# Patient Record
Sex: Female | Born: 1985 | Race: White | Hispanic: No | Marital: Married | Smoking: Former smoker
Health system: Southern US, Community
[De-identification: ages and names within clinical notes are randomized; demographics above are authoritative.]

## PROBLEM LIST (undated history)

## (undated) ENCOUNTER — Inpatient Hospital Stay (HOSPITAL_COMMUNITY): Payer: Self-pay

## (undated) DIAGNOSIS — K219 Gastro-esophageal reflux disease without esophagitis: Secondary | ICD-10-CM

## (undated) DIAGNOSIS — B999 Unspecified infectious disease: Secondary | ICD-10-CM

## (undated) DIAGNOSIS — O139 Gestational [pregnancy-induced] hypertension without significant proteinuria, unspecified trimester: Secondary | ICD-10-CM

## (undated) DIAGNOSIS — J40 Bronchitis, not specified as acute or chronic: Secondary | ICD-10-CM

---
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
## 2007-01-15 ENCOUNTER — Emergency Department: Payer: Self-pay | Admitting: Emergency Medicine

## 2007-05-15 ENCOUNTER — Emergency Department: Payer: Self-pay | Admitting: Emergency Medicine

## 2007-05-18 ENCOUNTER — Observation Stay: Payer: Self-pay | Admitting: Obstetrics & Gynecology

## 2007-05-23 ENCOUNTER — Observation Stay: Payer: Self-pay

## 2007-07-08 ENCOUNTER — Observation Stay: Payer: Self-pay

## 2007-07-23 ENCOUNTER — Emergency Department: Payer: Self-pay

## 2007-07-23 ENCOUNTER — Emergency Department: Payer: Self-pay | Admitting: Emergency Medicine

## 2007-07-30 ENCOUNTER — Inpatient Hospital Stay: Payer: Self-pay | Admitting: Unknown Physician Specialty

## 2007-08-07 ENCOUNTER — Emergency Department: Payer: Self-pay | Admitting: Emergency Medicine

## 2007-08-10 ENCOUNTER — Emergency Department: Payer: Self-pay | Admitting: Emergency Medicine

## 2007-11-10 ENCOUNTER — Emergency Department: Payer: Self-pay | Admitting: Emergency Medicine

## 2008-04-27 ENCOUNTER — Observation Stay: Payer: Self-pay

## 2008-06-02 ENCOUNTER — Observation Stay: Payer: Self-pay | Admitting: Obstetrics & Gynecology

## 2008-06-13 ENCOUNTER — Observation Stay: Payer: Self-pay | Admitting: Obstetrics and Gynecology

## 2008-07-18 ENCOUNTER — Inpatient Hospital Stay: Payer: Self-pay | Admitting: Obstetrics & Gynecology

## 2009-05-08 ENCOUNTER — Emergency Department (HOSPITAL_COMMUNITY): Admission: EM | Admit: 2009-05-08 | Discharge: 2009-05-08 | Payer: Self-pay | Admitting: Family Medicine

## 2010-09-21 ENCOUNTER — Emergency Department (HOSPITAL_COMMUNITY): Admission: EM | Admit: 2010-09-21 | Discharge: 2010-09-21 | Payer: Self-pay | Admitting: Family Medicine

## 2011-02-24 LAB — POCT RAPID STREP A (OFFICE): Streptococcus, Group A Screen (Direct): NEGATIVE

## 2011-03-22 LAB — POCT RAPID STREP A (OFFICE): Streptococcus, Group A Screen (Direct): NEGATIVE

## 2011-04-12 ENCOUNTER — Inpatient Hospital Stay (INDEPENDENT_AMBULATORY_CARE_PROVIDER_SITE_OTHER)
Admission: RE | Admit: 2011-04-12 | Discharge: 2011-04-12 | Disposition: A | Discharge status: Home / Self Care | Payer: Self-pay | Source: Ambulatory Visit | Attending: Family Medicine | Admitting: Family Medicine

## 2011-04-12 DIAGNOSIS — B353 Tinea pedis: Secondary | ICD-10-CM

## 2012-11-06 ENCOUNTER — Emergency Department (INDEPENDENT_AMBULATORY_CARE_PROVIDER_SITE_OTHER)
Admission: EM | Admit: 2012-11-06 | Discharge: 2012-11-06 | Disposition: A | Discharge status: Home / Self Care | Payer: Self-pay | Source: Home / Self Care | Attending: Family Medicine | Admitting: Family Medicine

## 2012-11-06 ENCOUNTER — Encounter (HOSPITAL_COMMUNITY): Payer: Self-pay | Admitting: Emergency Medicine

## 2012-11-06 DIAGNOSIS — J069 Acute upper respiratory infection, unspecified: Secondary | ICD-10-CM

## 2012-11-06 HISTORY — DX: Bronchitis, not specified as acute or chronic: J40

## 2012-11-06 MED ORDER — IPRATROPIUM BROMIDE 0.06 % NA SOLN: 2.0000 | Freq: Four times a day (QID) | NASAL | Status: DC

## 2012-11-06 NOTE — ED Provider Notes (Signed)
History     CSN: [id number]  Arrival date & time 11/06/12  1111   First MD Initiated Contact with Patient 11/06/12 1116      Chief Complaint  Patient presents with  . Bronchitis    (Consider location/radiation/quality/duration/timing/severity/associated sxs/prior treatment) Patient is a 25 y.o. female presenting with cough. The history is provided by the patient.  Cough This is a new problem. The current episode started 2 days ago. The problem has not changed since onset.The cough is productive of sputum. There has been no fever. Associated symptoms include rhinorrhea and sore throat. Pertinent negatives include no shortness of breath and no wheezing. She is not a smoker. Her past medical history is significant for bronchitis.    Past Medical History  Diagnosis Date  . Bronchitis     Past Surgical History  Procedure Date  . Cesarean section     No family history on file.  History  Substance Use Topics  . Smoking status: Former Games developer  . Smokeless tobacco: Not on file  . Alcohol Use: No    OB History    Grav Para Term Preterm Abortions TAB SAB Ect Mult Living                  Review of Systems  Constitutional: Negative.   HENT: Positive for congestion, sore throat, rhinorrhea and postnasal drip.   Respiratory: Positive for cough. Negative for shortness of breath and wheezing.   Cardiovascular: Negative.   Gastrointestinal: Negative.     Allergies  Review of patient's allergies indicates no known allergies.  Home Medications   Current Outpatient Rx  Name  Route  Sig  Dispense  Refill  . IPRATROPIUM BROMIDE 0.06 % NA SOLN   Nasal   Place 2 sprays into the nose 4 (four) times daily.   15 mL   1     BP 106/61  Pulse 80  Temp 97.9 F (36.6 C) (Oral)  Resp 20  SpO2 100%  LMP 10/12/2012  Physical Exam  Nursing note and vitals reviewed. Constitutional: She is oriented to person, place, and time. She appears well-developed and well-nourished.    HENT:  Head: Normocephalic.  Right Ear: External ear normal.  Left Ear: External ear normal.  Nose: Mucosal edema and rhinorrhea present.  Mouth/Throat: Oropharynx is clear and moist.  Eyes: Pupils are equal, round, and reactive to light.  Neck: Normal range of motion. Neck supple.  Cardiovascular: Regular rhythm and normal heart sounds.   Pulmonary/Chest: Effort normal and breath sounds normal.  Abdominal: Soft. Bowel sounds are normal.  Lymphadenopathy:    She has no cervical adenopathy.  Neurological: She is alert and oriented to person, place, and time.  Skin: Skin is warm and dry.    ED Course  Procedures (including critical care time)  Labs Reviewed - No data to display No results found.   1. URI (upper respiratory infection)       MDM          Linna Hoff, MD 11/06/12 1241

## 2012-11-06 NOTE — ED Notes (Signed)
Started 2 days ago of touches across chest as location of pain.  Hurts all the time, breathing in makes pain worse, sharp pain, green phlegm/brown.  Phlegm is difficult to cough out.  No fever.  Reports sore throat, right ear pain minimal

## 2012-11-06 NOTE — ED Notes (Signed)
Mother being seen with her child

## 2013-02-13 ENCOUNTER — Emergency Department (INDEPENDENT_AMBULATORY_CARE_PROVIDER_SITE_OTHER): Payer: Self-pay

## 2013-02-13 ENCOUNTER — Emergency Department (INDEPENDENT_AMBULATORY_CARE_PROVIDER_SITE_OTHER)
Admission: EM | Admit: 2013-02-13 | Discharge: 2013-02-13 | Disposition: A | Discharge status: Home / Self Care | Payer: Self-pay | Source: Home / Self Care | Attending: Emergency Medicine | Admitting: Emergency Medicine

## 2013-02-13 ENCOUNTER — Encounter (HOSPITAL_COMMUNITY): Payer: Self-pay

## 2013-02-13 DIAGNOSIS — S161XXA Strain of muscle, fascia and tendon at neck level, initial encounter: Secondary | ICD-10-CM

## 2013-02-13 DIAGNOSIS — S46012A Strain of muscle(s) and tendon(s) of the rotator cuff of left shoulder, initial encounter: Secondary | ICD-10-CM

## 2013-02-13 MED ORDER — METHOCARBAMOL 500 MG PO TABS: 500.0000 mg | ORAL_TABLET | Freq: Three times a day (TID) | ORAL | Status: DC

## 2013-02-13 MED ORDER — MELOXICAM 15 MG PO TABS: 15.0000 mg | ORAL_TABLET | Freq: Every day | ORAL | Status: DC

## 2013-02-13 MED ORDER — TRAMADOL HCL 50 MG PO TABS: 100.0000 mg | ORAL_TABLET | Freq: Three times a day (TID) | ORAL | Status: DC | PRN
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
## 2013-05-02 ENCOUNTER — Encounter (HOSPITAL_COMMUNITY): Payer: Self-pay

## 2013-05-02 ENCOUNTER — Inpatient Hospital Stay (HOSPITAL_COMMUNITY)
Admission: AD | Admit: 2013-05-02 | Discharge: 2013-05-02 | Disposition: A | Discharge status: Home / Self Care | Payer: Medicaid Other | Source: Ambulatory Visit | Attending: Obstetrics & Gynecology | Admitting: Obstetrics & Gynecology

## 2013-05-02 ENCOUNTER — Inpatient Hospital Stay (HOSPITAL_COMMUNITY): Payer: Medicaid Other

## 2013-05-02 DIAGNOSIS — B9689 Other specified bacterial agents as the cause of diseases classified elsewhere: Secondary | ICD-10-CM | POA: Insufficient documentation

## 2013-05-02 DIAGNOSIS — O9989 Other specified diseases and conditions complicating pregnancy, childbirth and the puerperium: Secondary | ICD-10-CM

## 2013-05-02 DIAGNOSIS — A499 Bacterial infection, unspecified: Secondary | ICD-10-CM | POA: Insufficient documentation

## 2013-05-02 DIAGNOSIS — N760 Acute vaginitis: Secondary | ICD-10-CM | POA: Insufficient documentation

## 2013-05-02 DIAGNOSIS — O2390 Unspecified genitourinary tract infection in pregnancy, unspecified trimester: Secondary | ICD-10-CM | POA: Insufficient documentation

## 2013-05-02 DIAGNOSIS — R109 Unspecified abdominal pain: Secondary | ICD-10-CM | POA: Insufficient documentation

## 2013-05-02 LAB — WET PREP, GENITAL: Trich, Wet Prep: NONE SEEN

## 2013-05-02 LAB — URINALYSIS, ROUTINE W REFLEX MICROSCOPIC
Bilirubin Urine: NEGATIVE
Glucose, UA: NEGATIVE mg/dL
Ketones, ur: 15 mg/dL — AB
Leukocytes, UA: NEGATIVE
Nitrite: NEGATIVE
Protein, ur: NEGATIVE mg/dL
pH: 6 (ref 5.0–8.0)

## 2013-05-02 LAB — CBC
Hemoglobin: 12.7 g/dL (ref 12.0–15.0)
MCH: 30.5 pg (ref 26.0–34.0)
MCHC: 33.9 g/dL (ref 30.0–36.0)
MCV: 89.9 fL (ref 78.0–100.0)
RBC: 4.17 MIL/uL (ref 3.87–5.11)

## 2013-05-02 LAB — URINE MICROSCOPIC-ADD ON

## 2013-05-02 MED ORDER — METRONIDAZOLE 500 MG PO TABS: 500.0000 mg | ORAL_TABLET | Freq: Two times a day (BID) | ORAL | Status: DC

## 2013-05-02 NOTE — MAU Provider Note (Signed)
History     CSN: [id number]  Arrival date and time: 05/02/13 1642   First Provider Initiated Contact with Patient 05/02/13 1946      Chief Complaint  Patient presents with  . Possible Pregnancy  . Abdominal Pain   HPI [name] is a 27 y.o. G4P3 at [redacted]w[redacted]d who presents to MAU today with complaint of +HPT and lower abdominal cramping. The patient states that she had Implanon removed in March 2014 and had one normal period in late March and then a short, light period in April. She had +HPT on Monday. Last night she started having lower abdominal cramping and occasional sharp pains in the lower abdomen. She denies vaginal bleeding, N/V, fever, dizziness, dysuria or urgency. She states some increased urinary frequency and vaginal discharge. The discharge is white and thin.   OB History   Grav Para Term Preterm Abortions TAB SAB Ect Mult Living   4 3        3       Past Medical History  Diagnosis Date  . Bronchitis   . Complication of anesthesia     Past Surgical History  Procedure Laterality Date  . Cesarean section      History reviewed. No pertinent family history.  History  Substance Use Topics  . Smoking status: Former Games developer  . Smokeless tobacco: Not on file  . Alcohol Use: No    Allergies: No Known Allergies  Prescriptions prior to admission  Medication Sig Dispense Refill  . Prenatal Vit-Fe Fumarate-FA (PRENATAL MULTIVITAMIN) TABS Take 1 tablet by mouth daily at 12 noon.      Marland Kitchen ipratropium (ATROVENT) 0.06 % nasal spray Place 2 sprays into the nose 4 (four) times daily.  15 mL  1  . traMADol (ULTRAM) 50 MG tablet Take 2 tablets (100 mg total) by mouth every 8 (eight) hours as needed for pain.  30 tablet  0  . [DISCONTINUED] meloxicam (MOBIC) 15 MG tablet Take 1 tablet (15 mg total) by mouth daily.  15 tablet  0  . [DISCONTINUED] methocarbamol (ROBAXIN) 500 MG tablet Take 1 tablet (500 mg total) by mouth 3 (three) times daily.  30 tablet  0    Review of  Systems  Constitutional: Negative for fever, chills and malaise/fatigue.  Gastrointestinal: Negative for nausea, vomiting, diarrhea and constipation.  Genitourinary: Positive for frequency. Negative for dysuria and urgency.       Neg - vaginal bleeding + vaginal discharge  Neurological: Negative for dizziness and loss of consciousness.   Physical Exam   Blood pressure 123/53, pulse 86, temperature 98.6 F (37 C), temperature source Oral, resp. rate 16, height 5\' 5"  (1.651 m), weight 113.399 kg (250 lb), last menstrual period 04/04/2013, SpO2 100.00%.  Physical Exam  Constitutional: She is oriented to person, place, and time. She appears well-developed and well-nourished. No distress.  HENT:  Head: Normocephalic and atraumatic.  Cardiovascular: Normal rate, regular rhythm and normal heart sounds.   Respiratory: Effort normal and breath sounds normal. No respiratory distress.  GI: Soft. Bowel sounds are normal. She exhibits no distension and no mass. There is tenderness (mild LLQ tenderness to palpation). There is no rebound and no guarding.  Genitourinary: Uterus is not enlarged and not tender. Cervix exhibits no motion tenderness, no discharge and no friability. Right adnexum displays no mass and no tenderness. Left adnexum displays no mass and no tenderness. No bleeding around the vagina. Vaginal discharge (scant thin, white discharge noted) found.  Neurological: She  is alert and oriented to person, place, and time.  Skin: Skin is warm and dry. No erythema.  Psychiatric: She has a normal mood and affect.    Results for orders placed during the hospital encounter of 05/02/13 (from the past 24 hour(s))  URINALYSIS, ROUTINE W REFLEX MICROSCOPIC     Status: Abnormal   Collection Time    05/02/13  5:20 PM      Result Value Range   Color, Urine YELLOW  YELLOW   APPearance HAZY (*) CLEAR   Specific Gravity, Urine >1.030 (*) 1.005 - 1.030   pH 6.0  5.0 - 8.0   Glucose, UA NEGATIVE   NEGATIVE mg/dL   Hgb urine dipstick TRACE (*) NEGATIVE   Bilirubin Urine NEGATIVE  NEGATIVE   Ketones, ur 15 (*) NEGATIVE mg/dL   Protein, ur NEGATIVE  NEGATIVE mg/dL   Urobilinogen, UA 0.2  0.0 - 1.0 mg/dL   Nitrite NEGATIVE  NEGATIVE   Leukocytes, UA NEGATIVE  NEGATIVE  URINE MICROSCOPIC-ADD ON     Status: Abnormal   Collection Time    05/02/13  5:20 PM      Result Value Range   Squamous Epithelial / LPF FEW (*) RARE   RBC / HPF 0-2  <3 RBC/hpf  POCT PREGNANCY, URINE     Status: Abnormal   Collection Time    05/02/13  5:51 PM      Result Value Range   Preg Test, Ur POSITIVE (*) NEGATIVE  HCG, QUANTITATIVE, PREGNANCY     Status: Abnormal   Collection Time    05/02/13  6:05 PM      Result Value Range   hCG, Beta Chain, Quant, S 396 (*) <5 mIU/mL  CBC     Status: None   Collection Time    05/02/13  6:06 PM      Result Value Range   WBC 7.1  4.0 - 10.5 K/uL   RBC 4.17  3.87 - 5.11 MIL/uL   Hemoglobin 12.7  12.0 - 15.0 g/dL   HCT 78.2  95.6 - 21.3 %   MCV 89.9  78.0 - 100.0 fL   MCH 30.5  26.0 - 34.0 pg   MCHC 33.9  30.0 - 36.0 g/dL   RDW 08.6  57.8 - 46.9 %   Platelets 209  150 - 400 K/uL  ABO/RH     Status: None   Collection Time    05/02/13  6:06 PM      Result Value Range   ABO/RH(D) O POS    WET PREP, GENITAL     Status: Abnormal   Collection Time    05/02/13  7:55 PM      Result Value Range   Yeast Wet Prep HPF POC NONE SEEN  NONE SEEN   Trich, Wet Prep NONE SEEN  NONE SEEN   Clue Cells Wet Prep HPF POC FEW (*) NONE SEEN   WBC, Wet Prep HPF POC FEW (*) NONE SEEN    MAU Course  Procedures None  MDM Wet Prep, GC/Chlamydia, CBC, ABO/Rh, quant hCG and [place] today Discussed [place] results with the patient. She is aware of all possibilities for outcome of this pregnancy. Early IUP vs Ectopic vs SAB. The patient has voiced understanding.  Assessment and Plan  A: Positive blood pregnancy test Abdominal pain in early pregnancy Bacterial vaginosis  P: Discharge  home Rx for Flagyl sent to patient's pharmacy Patient advised to follow-up in 48 hours for repeat quant hCG Bleeding and  Ectopic precautions discussed Patient may return to MAU sooner as needed  Freddi Starr, PA-C  05/02/2013, 8:31 PM

## 2013-05-02 NOTE — MAU Provider Note (Signed)
Attestation of Attending Supervision of Advanced Practitioner (PA/CNM/NP): Evaluation and management procedures were performed by the Advanced Practitioner under my supervision and collaboration.  I have reviewed the Advanced Practitioner's note and chart, and I agree with the management and plan.  Keidan Aumiller, MD, FACOG Attending Obstetrician & Gynecologist Faculty Practice, [facility]  

## 2013-05-02 NOTE — MAU Note (Signed)
Patient states she has had 3 positive home pregnancy tests. States she started having lower abdominal pain this am, more on the left but radiates to the right. Had an implanon removed in March that was a year overdue to be removed. Had bleeding on 3-16 after the removal then had bleeding again on 4-24, but not sure what was a period. Denies bleeding or discharge.

## 2013-05-03 LAB — GC/CHLAMYDIA PROBE AMP
CT Probe RNA: NEGATIVE
GC Probe RNA: NEGATIVE

## 2013-05-04 ENCOUNTER — Inpatient Hospital Stay (HOSPITAL_COMMUNITY)
Admission: AD | Admit: 2013-05-04 | Discharge: 2013-05-04 | Disposition: A | Discharge status: Home / Self Care | Payer: Medicaid Other | Source: Ambulatory Visit | Attending: Obstetrics & Gynecology | Admitting: Obstetrics & Gynecology

## 2013-05-04 DIAGNOSIS — E860 Dehydration: Secondary | ICD-10-CM | POA: Insufficient documentation

## 2013-05-04 DIAGNOSIS — Z0189 Encounter for other specified special examinations: Secondary | ICD-10-CM

## 2013-05-04 DIAGNOSIS — R109 Unspecified abdominal pain: Secondary | ICD-10-CM | POA: Insufficient documentation

## 2013-05-04 DIAGNOSIS — O99891 Other specified diseases and conditions complicating pregnancy: Secondary | ICD-10-CM | POA: Insufficient documentation

## 2013-05-04 NOTE — MAU Provider Note (Signed)
  History     CSN: [id number]  Arrival date and time: 05/04/13 1200  Seen by Provider at 1305    No chief complaint on file.  HPI Kristen Warner 27 y.o. [redacted]w[redacted]d Returns to MAU today for repeat quant.  Was seen on 05-02-13 with lower abdominal cramping which may have been due to mild dehydration.  Having no pain today.  No vaginal bleeding.  OB History   Grav Para Term Preterm Abortions TAB SAB Ect Mult Living   4 3        3       Past Medical History  Diagnosis Date  . Bronchitis   . Complication of anesthesia     Past Surgical History  Procedure Laterality Date  . Cesarean section      No family history on file.  History  Substance Use Topics  . Smoking status: Former Games developer  . Smokeless tobacco: Not on file  . Alcohol Use: No    Allergies: No Known Allergies  Prescriptions prior to admission  Medication Sig Dispense Refill  . ipratropium (ATROVENT) 0.06 % nasal spray Place 2 sprays into the nose 4 (four) times daily.  15 mL  1  . metroNIDAZOLE (FLAGYL) 500 MG tablet Take 1 tablet (500 mg total) by mouth 2 (two) times daily.  14 tablet  0  . Prenatal Vit-Fe Fumarate-FA (PRENATAL MULTIVITAMIN) TABS Take 1 tablet by mouth daily at 12 noon.      . traMADol (ULTRAM) 50 MG tablet Take 2 tablets (100 mg total) by mouth every 8 (eight) hours as needed for pain.  30 tablet  0    Review of Systems  Constitutional: Negative for fever.  Gastrointestinal: Negative for nausea, vomiting and abdominal pain.  Genitourinary:       No vaginal discharge. No vaginal bleeding. No dysuria.   Physical Exam   Last menstrual period 04/04/2013.  Physical Exam  Nursing note and vitals reviewed. Constitutional: She is oriented to person, place, and time. She appears well-developed and well-nourished.  HENT:  Head: Normocephalic.  Eyes: EOM are normal.  Neck: Neck supple.  Musculoskeletal: Normal range of motion.  Neurological: She is alert and oriented to person, place, and time.   Skin: Skin is warm and dry.  Psychiatric: She has a normal mood and affect.    MAU Course  Procedures Results for Kristen, Warner (MRN [id number]) as of 05/04/2013 13:03  Ref. Range 05/02/2013 18:06 05/02/2013 19:21 05/02/2013 19:55 05/04/2013 01:09 05/04/2013 12:29  hCG, Beta Chain, Quant, S Latest Range: <5 mIU/mL 396    842 (H)   MDM Appropriate rise in quant in 48 hours  Assessment and Plan  Appropriately rising quants  Plan Ultrasound to call client and schedule repeat ultrasound on 05-09-13 or later. Return sooner for vaginal bleeding or severe pain.  Kristen Warner 05/04/2013, 1:04 PM

## 2013-05-07 ENCOUNTER — Other Ambulatory Visit: Payer: Self-pay | Admitting: Obstetrics and Gynecology

## 2013-05-07 DIAGNOSIS — R109 Unspecified abdominal pain: Secondary | ICD-10-CM

## 2013-05-09 ENCOUNTER — Ambulatory Visit (HOSPITAL_COMMUNITY)
Admission: RE | Admit: 2013-05-09 | Discharge: 2013-05-09 | Disposition: A | Discharge status: Home / Self Care | Payer: Medicaid Other | Source: Ambulatory Visit | Attending: Obstetrics and Gynecology | Admitting: Obstetrics and Gynecology

## 2013-05-09 ENCOUNTER — Inpatient Hospital Stay (HOSPITAL_COMMUNITY)
Admission: AD | Admit: 2013-05-09 | Discharge: 2013-05-09 | Disposition: A | Discharge status: Home / Self Care | Payer: Medicaid Other | Source: Ambulatory Visit | Attending: Family Medicine | Admitting: Family Medicine

## 2013-05-09 DIAGNOSIS — O99210 Obesity complicating pregnancy, unspecified trimester: Secondary | ICD-10-CM | POA: Insufficient documentation

## 2013-05-09 DIAGNOSIS — Z3689 Encounter for other specified antenatal screening: Secondary | ICD-10-CM | POA: Insufficient documentation

## 2013-05-09 DIAGNOSIS — O99891 Other specified diseases and conditions complicating pregnancy: Secondary | ICD-10-CM | POA: Insufficient documentation

## 2013-05-09 DIAGNOSIS — O26899 Other specified pregnancy related conditions, unspecified trimester: Secondary | ICD-10-CM

## 2013-05-09 DIAGNOSIS — R109 Unspecified abdominal pain: Secondary | ICD-10-CM | POA: Insufficient documentation

## 2013-05-09 DIAGNOSIS — E669 Obesity, unspecified: Secondary | ICD-10-CM | POA: Insufficient documentation

## 2013-05-09 NOTE — Discharge Instructions (Signed)
Abdominal Pain During Pregnancy Abdominal discomfort is common in pregnancy. Most of the time, it does not cause harm. There are many causes of abdominal pain. Some causes are more serious than others. Some of the causes of abdominal pain in pregnancy are easily diagnosed. Occasionally, the diagnosis takes time to understand. Other times, the cause is not determined. Abdominal pain can be a sign that something is very wrong with the pregnancy, or the pain may have nothing to do with the pregnancy at all. For this reason, always tell your caregiver if you have any abdominal discomfort. CAUSES Common and harmless causes of abdominal pain include:  Constipation.  Excess gas and bloating.  Round ligament pain. This is pain that is felt in the folds of the groin.  The position the baby or placenta is in.  Baby kicks.  Braxton-Hicks contractions. These are mild contractions that do not cause cervical dilation. Serious causes of abdominal pain include:  Ectopic pregnancy. This happens when a fertilized egg implants outside of the uterus.  Miscarriage.  Preterm labor. This is when labor starts at less than 37 weeks of pregnancy.  Placental abruption. This is when the placenta partially or completely separates from the uterus.  Preeclampsia. This is often associated with high blood pressure and has been referred to as "toxemia in pregnancy."  Uterine or amniotic fluid infections. Causes unrelated to pregnancy include:  Urinary tract infection.  Gallbladder stones or inflammation.  Hepatitis or other liver illness.  Intestinal problems, stomach flu, food poisoning, or ulcer.  Appendicitis.  Kidney (renal) stones.  Kidney infection (pylonephritis). HOME CARE INSTRUCTIONS  For mild pain:  Do not have sexual intercourse or put anything in your vagina until your symptoms go away completely.  Get plenty of rest until your pain improves. If your pain does not improve in 1 hour, call  your caregiver.  Drink clear fluids if you feel nauseous. Avoid solid food as long as you are uncomfortable or nauseous.  Only take medicine as directed by your caregiver.  Keep all follow-up appointments with your caregiver. SEEK IMMEDIATE MEDICAL CARE IF:  You are bleeding, leaking fluid, or passing tissue from the vagina.  You have increasing pain or cramping.  You have persistent vomiting.  You have painful or bloody urination.  You have a fever.  You notice a decrease in your baby's movements.  You have extreme weakness or feel faint.  You have shortness of breath, with or without abdominal pain.  You develop a severe headache with abdominal pain.  You have abnormal vaginal discharge with abdominal pain.  You have persistent diarrhea.  You have abdominal pain that continues even after rest, or gets worse. MAKE SURE YOU:   Understand these instructions.  Will watch your condition.  Will get help right away if you are not doing well or get worse. Document Released: 11/28/2005 Document Revised: 02/20/2012 Document Reviewed: 06/24/2011 [facility] Patient Information 2014 [place], [place].  Abdominal Pain During Pregnancy Abdominal discomfort is common in pregnancy. Most of the time, it does not cause harm. There are many causes of abdominal pain. Some causes are more serious than others. Some of the causes of abdominal pain in pregnancy are easily diagnosed. Occasionally, the diagnosis takes time to understand. Other times, the cause is not determined. Abdominal pain can be a sign that something is very wrong with the pregnancy, or the pain may have nothing to do with the pregnancy at all. For this reason, always tell your caregiver if you have  any abdominal discomfort. CAUSES Common and harmless causes of abdominal pain include:  Constipation.  Excess gas and bloating.  Round ligament pain. This is pain that is felt in the folds of the groin.  The position the baby  or placenta is in.  Baby kicks.  Braxton-Hicks contractions. These are mild contractions that do not cause cervical dilation. Serious causes of abdominal pain include:  Ectopic pregnancy. This happens when a fertilized egg implants outside of the uterus.  Miscarriage.  Preterm labor. This is when labor starts at less than 37 weeks of pregnancy.  Placental abruption. This is when the placenta partially or completely separates from the uterus.  Preeclampsia. This is often associated with high blood pressure and has been referred to as "toxemia in pregnancy."  Uterine or amniotic fluid infections. Causes unrelated to pregnancy include:  Urinary tract infection.  Gallbladder stones or inflammation.  Hepatitis or other liver illness.  Intestinal problems, stomach flu, food poisoning, or ulcer.  Appendicitis.  Kidney (renal) stones.  Kidney infection (pylonephritis). HOME CARE INSTRUCTIONS  For mild pain:  Do not have sexual intercourse or put anything in your vagina until your symptoms go away completely.  Get plenty of rest until your pain improves. If your pain does not improve in 1 hour, call your caregiver.  Drink clear fluids if you feel nauseous. Avoid solid food as long as you are uncomfortable or nauseous.  Only take medicine as directed by your caregiver.  Keep all follow-up appointments with your caregiver. SEEK IMMEDIATE MEDICAL CARE IF:  You are bleeding, leaking fluid, or passing tissue from the vagina.  You have increasing pain or cramping.  You have persistent vomiting.  You have painful or bloody urination.  You have a fever.  You notice a decrease in your baby's movements.  You have extreme weakness or feel faint.  You have shortness of breath, with or without abdominal pain.  You develop a severe headache with abdominal pain.  You have abnormal vaginal discharge with abdominal pain.  You have persistent diarrhea.  You have abdominal  pain that continues even after rest, or gets worse. MAKE SURE YOU:   Understand these instructions.  Will watch your condition.  Will get help right away if you are not doing well or get worse. Document Released: 11/28/2005 Document Revised: 02/20/2012 Document Reviewed: 06/24/2011 [facility] Patient Information 2014 [place], [place].

## 2013-05-09 NOTE — MAU Note (Signed)
Patient to MAU after ultrasound for confirmation of viability. Denies pain or bleeding.

## 2013-05-14 ENCOUNTER — Inpatient Hospital Stay (HOSPITAL_COMMUNITY)
Admission: AD | Admit: 2013-05-14 | Discharge: 2013-05-14 | Disposition: A | Discharge status: Home / Self Care | Payer: Medicaid Other | Source: Ambulatory Visit | Attending: Obstetrics & Gynecology | Admitting: Obstetrics & Gynecology

## 2013-05-14 ENCOUNTER — Encounter (HOSPITAL_COMMUNITY): Payer: Self-pay | Admitting: *Deleted

## 2013-05-14 DIAGNOSIS — K219 Gastro-esophageal reflux disease without esophagitis: Secondary | ICD-10-CM | POA: Insufficient documentation

## 2013-05-14 DIAGNOSIS — B3781 Candidal esophagitis: Secondary | ICD-10-CM

## 2013-05-14 DIAGNOSIS — B373 Candidiasis of vulva and vagina: Secondary | ICD-10-CM | POA: Insufficient documentation

## 2013-05-14 DIAGNOSIS — R21 Rash and other nonspecific skin eruption: Secondary | ICD-10-CM | POA: Insufficient documentation

## 2013-05-14 DIAGNOSIS — O2390 Unspecified genitourinary tract infection in pregnancy, unspecified trimester: Secondary | ICD-10-CM | POA: Insufficient documentation

## 2013-05-14 DIAGNOSIS — B353 Tinea pedis: Secondary | ICD-10-CM

## 2013-05-14 DIAGNOSIS — O99891 Other specified diseases and conditions complicating pregnancy: Secondary | ICD-10-CM | POA: Insufficient documentation

## 2013-05-14 DIAGNOSIS — B3731 Acute candidiasis of vulva and vagina: Secondary | ICD-10-CM | POA: Insufficient documentation

## 2013-05-14 DIAGNOSIS — B370 Candidal stomatitis: Secondary | ICD-10-CM

## 2013-05-14 DIAGNOSIS — K920 Hematemesis: Secondary | ICD-10-CM | POA: Insufficient documentation

## 2013-05-14 LAB — URINALYSIS, ROUTINE W REFLEX MICROSCOPIC
Bilirubin Urine: NEGATIVE
Glucose, UA: NEGATIVE mg/dL
Hgb urine dipstick: NEGATIVE
Ketones, ur: NEGATIVE mg/dL
Protein, ur: NEGATIVE mg/dL
pH: 6.5 (ref 5.0–8.0)

## 2013-05-14 LAB — WET PREP, GENITAL: Clue Cells Wet Prep HPF POC: NONE SEEN

## 2013-05-14 MED ORDER — NYSTATIN 100000 UNIT/ML MT SUSP: 500000.0000 [IU] | Freq: Four times a day (QID) | OROMUCOSAL | Status: DC

## 2013-05-14 MED ORDER — MICONAZOLE NITRATE 2 % EX AERO: 1.0000 "application " | INHALATION_SPRAY | Freq: Three times a day (TID) | CUTANEOUS | Status: DC

## 2013-05-14 MED ORDER — FLUCONAZOLE 100 MG PO TABS: 100.0000 mg | ORAL_TABLET | Freq: Every day | ORAL | Status: DC

## 2013-05-14 MED ORDER — GI COCKTAIL ~~LOC~~
30.0000 mL | Freq: Once | ORAL | Status: AC
  Administered 2013-05-14: 30 mL via ORAL
  Filled 2013-05-14: qty 30

## 2013-05-14 MED ORDER — PANTOPRAZOLE SODIUM 20 MG PO TBEC: 20.0000 mg | DELAYED_RELEASE_TABLET | Freq: Every day | ORAL | Status: DC

## 2013-05-14 NOTE — Progress Notes (Signed)
Written and verbal d/c instructions given and understanding voiced. 

## 2013-05-14 NOTE — MAU Provider Note (Signed)
Attestation of Attending Supervision of Advanced Practitioner (CNM/NP): Evaluation and management procedures were performed by the Advanced Practitioner under my supervision and collaboration.  I have reviewed the Advanced Practitioner's note and chart, and I agree with the management and plan.  HARRAWAY-SMITH, Eldridge Marcott 5:18 PM

## 2013-05-14 NOTE — MAU Note (Signed)
Ate ham/egg/cheese biscuit this am. Threw up afterward and saw blood in vomit. Finished antibiotic for BV on Friday. Think I may have yeast infection and possibly thrush. Felt like something was in my throat this morning when I woke up

## 2013-05-14 NOTE — MAU Provider Note (Signed)
History     CSN: [id number]  Arrival date and time: 05/14/13 1325   None     Chief Complaint  Patient presents with  . Hematemesis   HPI This is a 27 y.o. female at [redacted]w[redacted]d who presents with c/o blood in vomitus.  Has only vomited a few times but has daily burning pain c/w esophagitis. Also c/o feeling fullness when swallowing. Thinks she has a yeast infection. Just took Flagyl.  C/O scaling red rash on feet.  Admits to recent water park visit.  RN Note: Ate ham/egg/cheese biscuit this am. Threw up afterward and saw blood in vomit. Finished antibiotic for BV on Friday. Think I may have yeast infection and possibly thrush. Felt like something was in my throat this morning when I woke up       OB History   Grav Para Term Preterm Abortions TAB SAB Ect Mult Living   4 3 1 2      3       Past Medical History  Diagnosis Date  . Bronchitis   . Complication of anesthesia     Past Surgical History  Procedure Laterality Date  . Cesarean section      Family History  Problem Relation Age of Onset  . Hypertension Mother   . Heart disease Father   . Diabetes Sister   . Heart disease Paternal Grandfather     History  Substance Use Topics  . Smoking status: Former Games developer  . Smokeless tobacco: Not on file  . Alcohol Use: No    Allergies: No Known Allergies  Prescriptions prior to admission  Medication Sig Dispense Refill  . acetaminophen (TYLENOL) 500 MG tablet Take 1,000 mg by mouth every 6 (six) hours as needed for pain.      . metroNIDAZOLE (FLAGYL) 500 MG tablet Take 500 mg by mouth 2 (two) times daily.      . miconazole (MICOTIN) 200 MG vaginal suppository Place 200 mg vaginally at bedtime. 3 day treatment started 05-13-13      . Prenatal Vit-Fe Fumarate-FA (PRENATAL MULTIVITAMIN) TABS Take 1 tablet by mouth daily at 12 noon.        Review of Systems  Constitutional: Negative for fever, chills and malaise/fatigue.  Eyes: Negative for blurred vision.  Cardiovascular:  Positive for chest pain (with swallowing, "heartburn").  Neurological: Negative for weakness and headaches.   Physical Exam   Blood pressure 131/60, pulse 86, temperature 98.7 F (37.1 C), temperature source Oral, resp. rate 18, height 5\' 5"  (1.651 m), weight 112.946 kg (249 lb), last menstrual period 04/04/2013, SpO2 99.00%.  Physical Exam  Constitutional: She is oriented to person, place, and time. She appears well-developed and well-nourished. No distress.  HENT:  Head: Normocephalic.  White patches on tongue, mild erethema  Cardiovascular: Normal rate.   Respiratory: Effort normal.  GI: Soft. She exhibits no distension and no mass. There is no tenderness. There is no rebound and no guarding.  Picture of hematemesis showed several cc's of blood in vomitus  Genitourinary: Uterus normal. Vaginal discharge (curdlike discharge) found.  Musculoskeletal: Normal range of motion.  Neurological: She is alert and oriented to person, place, and time.  Skin: Skin is warm and dry.  Peeling, red skin on soles of feet  Psychiatric: She has a normal mood and affect.    MAU Course  Procedures  Assessment and Plan  A:  SIUP at [redacted]w[redacted]d       GERD       Vaginal Yeast  Probable thrush      Tinea Pedis  P:  Discussed yeast infections       I suspect she might be insulin resistant based on family history and habitus. Discussed testing and modifying carbs / sweets in diet       Recommend Activia yogurt and/or Probiotics       Discussed prenatal care. List given of providers. Wants TOLAC after 2 sections, Discussed will need to ask provider if they are willing to try this.           Medication List    STOP taking these medications       miconazole 200 MG vaginal suppository  Commonly known as:  MICOTIN      TAKE these medications       acetaminophen 500 MG tablet  Commonly known as:  TYLENOL  Take 1,000 mg by mouth every 6 (six) hours as needed for pain.     fluconazole 100 MG  tablet  Commonly known as:  DIFLUCAN  Take 1 tablet (100 mg total) by mouth daily.     metroNIDAZOLE 500 MG tablet  Commonly known as:  FLAGYL  Take 500 mg by mouth 2 (two) times daily.     Miconazole Nitrate 2 % Aero  Apply 1 application topically every 8 (eight) hours.     nystatin 100000 UNIT/ML suspension  Commonly known as:  MYCOSTATIN  Take 5 mLs (500,000 Units total) by mouth 4 (four) times daily.     pantoprazole 20 MG tablet  Commonly known as:  PROTONIX  Take 1 tablet (20 mg total) by mouth daily.     prenatal multivitamin Tabs  Take 1 tablet by mouth daily at 12 noon.          Wynelle Bourgeois 05/14/2013, 2:16 PM

## 2013-05-14 NOTE — Progress Notes (Signed)
Wet prep only collected without speculum.

## 2013-05-14 NOTE — MAU Note (Signed)
Pt states GI cocktail helped initially but "still feel like I have heartburn. Still feels like something stuck in my chest"

## 2013-05-17 ENCOUNTER — Inpatient Hospital Stay (HOSPITAL_COMMUNITY)
Admission: AD | Admit: 2013-05-17 | Discharge: 2013-05-18 | Disposition: A | Discharge status: Home / Self Care | Payer: Medicaid Other | Source: Ambulatory Visit | Attending: Obstetrics & Gynecology | Admitting: Obstetrics & Gynecology

## 2013-05-17 ENCOUNTER — Encounter (HOSPITAL_COMMUNITY): Payer: Self-pay

## 2013-05-17 DIAGNOSIS — O209 Hemorrhage in early pregnancy, unspecified: Secondary | ICD-10-CM

## 2013-05-17 DIAGNOSIS — B373 Candidiasis of vulva and vagina: Secondary | ICD-10-CM | POA: Insufficient documentation

## 2013-05-17 DIAGNOSIS — R109 Unspecified abdominal pain: Secondary | ICD-10-CM | POA: Insufficient documentation

## 2013-05-17 DIAGNOSIS — O210 Mild hyperemesis gravidarum: Secondary | ICD-10-CM | POA: Insufficient documentation

## 2013-05-17 DIAGNOSIS — B3731 Acute candidiasis of vulva and vagina: Secondary | ICD-10-CM | POA: Insufficient documentation

## 2013-05-17 DIAGNOSIS — O2390 Unspecified genitourinary tract infection in pregnancy, unspecified trimester: Secondary | ICD-10-CM | POA: Insufficient documentation

## 2013-05-17 LAB — URINE MICROSCOPIC-ADD ON

## 2013-05-17 LAB — URINALYSIS, ROUTINE W REFLEX MICROSCOPIC
Bilirubin Urine: NEGATIVE
Nitrite: NEGATIVE
Specific Gravity, Urine: >1.03 — ABNORMAL HIGH (ref 1.005–1.030)
Urobilinogen, UA: 0.2 mg/dL (ref 0.0–1.0)
pH: 6 (ref 5.0–8.0)

## 2013-05-17 NOTE — MAU Note (Signed)
Seen last week for yeast infection and finished med for that yesterday. Wiped today and noticed pink on tissue and having some cramping R lower abdomen

## 2013-05-17 NOTE — MAU Provider Note (Signed)
History     CSN: [id number]  Arrival date and time: 05/17/13 2146   First Provider Initiated Contact with Patient 05/17/13 2343      Chief Complaint  Patient presents with  . Abdominal Pain  . Vaginal Bleeding   HPI 27 y.o. Kristen Warner at [redacted]w[redacted]d with pink discharge and cramping for past 3 hours. Cramping is lower abdominal and getting worse. Pink discharge on TP and on underwear. Had yeast infection on wet prep 3 days ago. Had 3 doses of diflucan. Having some burning with urination. Some mild nausea/vomiting over past week. No fever/chills. Ultrasound done 5/29 showed intrauterine gestational sac and yolk sac dated about 5 weeks, but no fetal pole.   No prenatal care yet. Vaginal birth followed by 2 cesarean deliveries in [place]. Desires TOLAC.   OB History   Grav Para Term Preterm Abortions TAB SAB Ect Mult Living   4 3 1 2      3       Past Medical History  Diagnosis Date  . Bronchitis   . Complication of anesthesia     Past Surgical History  Procedure Laterality Date  . Cesarean section      Family History  Problem Relation Age of Onset  . Hypertension Mother   . Heart disease Father   . Diabetes Sister   . Heart disease Paternal Grandfather     History  Substance Use Topics  . Smoking status: Former Games developer  . Smokeless tobacco: Not on file  . Alcohol Use: No    Allergies: No Known Allergies  Prescriptions prior to admission  Medication Sig Dispense Refill  . acetaminophen (TYLENOL) 500 MG tablet Take 1,000 mg by mouth every 6 (six) hours as needed for pain.      . fluconazole (DIFLUCAN) 100 MG tablet Take 1 tablet (100 mg total) by mouth daily.  3 tablet  0  . metroNIDAZOLE (FLAGYL) 500 MG tablet Take 500 mg by mouth 2 (two) times daily.      . Miconazole Nitrate 2 % AERO Apply 1 application topically every 8 (eight) hours.  1 Bottle  0  . nystatin (MYCOSTATIN) 100000 UNIT/ML suspension Take 5 mLs (500,000 Units total) by mouth 4 (four) times daily.  60 mL   0  . pantoprazole (PROTONIX) 20 MG tablet Take 1 tablet (20 mg total) by mouth daily.  30 tablet  2  . Prenatal Vit-Fe Fumarate-FA (PRENATAL MULTIVITAMIN) TABS Take 1 tablet by mouth daily at 12 noon.        ROS  See HPI  Physical Exam   Blood pressure 126/67, pulse 77, temperature 98.5 F (36.9 C), resp. rate 20, height 5\' 5"  (1.651 m), weight 113.671 kg (250 lb 9.6 oz), last menstrual period 04/04/2013, SpO2 100.00%.  Physical Exam GEN:  WNWD, no distress HEENT:  NCAT, EOMI, conjunctiva clear NECK:  Supple, non-tender, no thyromegaly, trachea midline CV: RRR, no murmur RESP:  CTAB ABD:  Soft, no guarding or rebound, normal bowel sounds, mild suprapubic tenderness EXTREM:  Warm, well perfused, no edema or tenderness NEURO:  Alert, oriented, no focal deficits GU:  Normal external genitalia. Normal vagina. Cervix parous, normal. Small spec of clot near os. Light brown blood on speculum and glove after exam. Cervix closed by digital exam. No gestational sac visible on bedside ultrasound  Results for orders placed during the hospital encounter of 05/17/13 (from the past 24 hour(s))  URINALYSIS, ROUTINE W REFLEX MICROSCOPIC     Status: Abnormal   Collection Time  05/17/13  9:58 PM      Result Value Range   Color, Urine YELLOW  YELLOW   APPearance CLEAR  CLEAR   Specific Gravity, Urine >1.030 (*) 1.005 - 1.030   pH 6.0  5.0 - 8.0   Glucose, UA NEGATIVE  NEGATIVE mg/dL   Hgb urine dipstick LARGE (*) NEGATIVE   Bilirubin Urine NEGATIVE  NEGATIVE   Ketones, ur NEGATIVE  NEGATIVE mg/dL   Protein, ur NEGATIVE  NEGATIVE mg/dL   Urobilinogen, UA 0.2  0.0 - 1.0 mg/dL   Nitrite NEGATIVE  NEGATIVE   Leukocytes, UA TRACE (*) NEGATIVE  URINE MICROSCOPIC-ADD ON     Status: None   Collection Time    05/17/13  9:58 PM      Result Value Range   Squamous Epithelial / LPF RARE  RARE   WBC, UA 0-2  <3 WBC/hpf   RBC / HPF 0-2  <3 RBC/hpf  HCG, QUANTITATIVE, PREGNANCY     Status: Abnormal    Collection Time    05/17/13 11:39 PM      Result Value Range   hCG, Beta Chain, Quant, S [address]109 (*) <5 mIU/mL    MAU Course  Procedures   Assessment and Plan  27 y.o. [id number] at [redacted]w[redacted]d by ultrasound on ultrasound 5/29 5 weeks) - Bleeding and cramping - threatened miscarriage. Blood type O pos - no rhogam needed. - Yeast infection previously treated but still symptomatic -repeat dose of diflucan - Quant rising appropriately. Ultrasound 5/29 showed gestational sac and yolk sac but no fetal pole. Recommended repeat ultrasound no less than 10 days later - Will schedule outpatient ultrasound early next week - Pt plans to research OB providers, wants to find someone willing to permit TOLAC after 2 cesareans. - Stable for discharge home.   Napoleon Form 05/17/2013, 11:46 PM

## 2013-05-18 DIAGNOSIS — O209 Hemorrhage in early pregnancy, unspecified: Secondary | ICD-10-CM

## 2013-05-18 MED ORDER — FLUCONAZOLE 150 MG PO TABS: 150.0000 mg | ORAL_TABLET | Freq: Once | ORAL | Status: DC

## 2013-05-18 MED ORDER — PROMETHAZINE HCL 25 MG PO TABS: 25.0000 mg | ORAL_TABLET | Freq: Four times a day (QID) | ORAL | Status: DC | PRN

## 2013-05-18 MED ORDER — ONDANSETRON 4 MG PO TBDP: 4.0000 mg | ORAL_TABLET | Freq: Four times a day (QID) | ORAL | Status: DC | PRN

## 2013-05-18 MED ORDER — OXYCODONE-ACETAMINOPHEN 5-325 MG PO TABS: 1.0000 | ORAL_TABLET | Freq: Four times a day (QID) | ORAL | Status: DC | PRN

## 2013-05-18 MED ORDER — OXYCODONE-ACETAMINOPHEN 5-325 MG PO TABS
2.0000 | ORAL_TABLET | Freq: Once | ORAL | Status: AC
  Administered 2013-05-18: 2 via ORAL
  Filled 2013-05-18: qty 2

## 2013-05-19 NOTE — MAU Provider Note (Signed)
Attestation of Attending Supervision of Fellow: Evaluation and management procedures were performed by the Fellow under my supervision and collaboration.  I have reviewed the Fellow's note and chart, and I agree with the management and plan.    

## 2013-05-22 ENCOUNTER — Ambulatory Visit (HOSPITAL_COMMUNITY)
Admission: RE | Admit: 2013-05-22 | Discharge: 2013-05-22 | Disposition: A | Discharge status: Home / Self Care | Payer: Medicaid Other | Source: Ambulatory Visit | Attending: Family Medicine | Admitting: Family Medicine

## 2013-05-22 ENCOUNTER — Inpatient Hospital Stay (HOSPITAL_COMMUNITY)
Admission: AD | Admit: 2013-05-22 | Discharge: 2013-05-22 | Disposition: A | Discharge status: Home / Self Care | Payer: Medicaid Other | Source: Ambulatory Visit | Attending: Obstetrics & Gynecology | Admitting: Obstetrics & Gynecology

## 2013-05-22 DIAGNOSIS — B009 Herpesviral infection, unspecified: Secondary | ICD-10-CM | POA: Insufficient documentation

## 2013-05-22 DIAGNOSIS — O98519 Other viral diseases complicating pregnancy, unspecified trimester: Secondary | ICD-10-CM | POA: Insufficient documentation

## 2013-05-22 DIAGNOSIS — Z3201 Encounter for pregnancy test, result positive: Secondary | ICD-10-CM

## 2013-05-22 DIAGNOSIS — A6000 Herpesviral infection of urogenital system, unspecified: Secondary | ICD-10-CM

## 2013-05-22 DIAGNOSIS — O209 Hemorrhage in early pregnancy, unspecified: Secondary | ICD-10-CM | POA: Insufficient documentation

## 2013-05-22 DIAGNOSIS — Z3689 Encounter for other specified antenatal screening: Secondary | ICD-10-CM | POA: Insufficient documentation

## 2013-05-22 DIAGNOSIS — Z3490 Encounter for supervision of normal pregnancy, unspecified, unspecified trimester: Secondary | ICD-10-CM

## 2013-05-22 DIAGNOSIS — O3680X0 Pregnancy with inconclusive fetal viability, not applicable or unspecified: Secondary | ICD-10-CM | POA: Insufficient documentation

## 2013-05-22 MED ORDER — VALACYCLOVIR HCL 1 G PO TABS: 1000.0000 mg | ORAL_TABLET | Freq: Two times a day (BID) | ORAL | Status: AC

## 2013-05-22 NOTE — MAU Provider Note (Signed)
  History     CSN: [id number]  Arrival date and time: 05/22/13 1517   None     Chief Complaint  Patient presents with  . Follow-up   HPI Kristen Warner is 27 y.o. [id number] [redacted]w[redacted]d weeks presenting for repeat ultrasound for viability.  She has been followed with [facility]s and U/S since 05/02/13.  She denies bleeding and pain today.  Patient has oral "fever" blisters and asking for medication.      Past Medical History  Diagnosis Date  . Bronchitis   . Complication of anesthesia     Past Surgical History  Procedure Laterality Date  . Cesarean section      Family History  Problem Relation Age of Onset  . Hypertension Mother   . Heart disease Father   . Diabetes Sister   . Heart disease Paternal Grandfather     History  Substance Use Topics  . Smoking status: Former Games developer  . Smokeless tobacco: Not on file  . Alcohol Use: No    Allergies: No Known Allergies  Prescriptions prior to admission  Medication Sig Dispense Refill  . acetaminophen (TYLENOL) 500 MG tablet Take 1,000 mg by mouth every 6 (six) hours as needed for pain.      . fluconazole (DIFLUCAN) 150 MG tablet Take 1 tablet (150 mg total) by mouth once.  1 tablet  0  . ondansetron (ZOFRAN ODT) 4 MG disintegrating tablet Take 1 tablet (4 mg total) by mouth every 6 (six) hours as needed for nausea.  30 tablet  0  . oxyCODONE-acetaminophen (PERCOCET/ROXICET) 5-325 MG per tablet Take 1 tablet by mouth every 6 (six) hours as needed for pain.  15 tablet  0  . pantoprazole (PROTONIX) 20 MG tablet Take 1 tablet (20 mg total) by mouth daily.  30 tablet  2  . Prenatal Vit-Fe Fumarate-FA (PRENATAL MULTIVITAMIN) TABS Take 1 tablet by mouth daily at 12 noon.      . promethazine (PHENERGAN) 25 MG tablet Take 1 tablet (25 mg total) by mouth every 6 (six) hours as needed for nausea.  30 tablet  2    Review of Systems  Gastrointestinal: Negative for abdominal pain.  Genitourinary:       Neg for vaginal bleeding   Physical Exam    Blood pressure 115/58, pulse 80, temperature 99.3 F (37.4 C), temperature source Oral, resp. rate 16, last menstrual period 04/04/2013, SpO2 100.00%.  Physical Exam  Constitutional: She is oriented to person, place, and time. She appears well-developed and well-nourished. No distress.  Neurological: She is alert and oriented to person, place, and time.  Psychiatric: She has a normal mood and affect. Her behavior is normal.       MAU Course  Procedures  MDM   Assessment and Plan  A:  Viable intrauterine pregnancy at [redacted]w[redacted]d      Herpes labialis   P:  Begin prenatal care with doctor of her choice.        RX for Valtrex 1gm 2 tabs po now and repeat in hrs  [name],EVE M 05/22/2013, 4:08 PM

## 2013-05-22 NOTE — MAU Note (Signed)
Patient to MAU after ultrasound. Patient denies pain or bleeding but does continue to have nausea and vomiting. States she has medication at home.

## 2013-05-22 NOTE — MAU Provider Note (Signed)
Attestation of Attending Supervision of Advanced Practitioner (CNM/NP): Evaluation and management procedures were performed by the Advanced Practitioner under my supervision and collaboration.  I have reviewed the Advanced Practitioner's note and chart, and I agree with the management and plan.  HARRAWAY-SMITH, Kristiane Morsch 4:58 PM     

## 2013-06-05 LAB — OB RESULTS CONSOLE ANTIBODY SCREEN: ANTIBODY SCREEN: NEGATIVE

## 2013-06-05 LAB — OB RESULTS CONSOLE HEPATITIS B SURFACE ANTIGEN: Hepatitis B Surface Ag: NEGATIVE

## 2013-06-05 LAB — OB RESULTS CONSOLE RPR: RPR: NONREACTIVE

## 2013-06-05 LAB — OB RESULTS CONSOLE ABO/RH: RH TYPE: POSITIVE

## 2013-06-05 LAB — OB RESULTS CONSOLE RUBELLA ANTIBODY, IGM: Rubella: IMMUNE

## 2013-06-05 LAB — OB RESULTS CONSOLE HIV ANTIBODY (ROUTINE TESTING): HIV: NONREACTIVE

## 2013-06-25 LAB — OB RESULTS CONSOLE GBS: GBS: POSITIVE

## 2013-08-05 ENCOUNTER — Encounter (HOSPITAL_COMMUNITY): Payer: Self-pay | Admitting: *Deleted

## 2013-08-05 ENCOUNTER — Emergency Department (HOSPITAL_COMMUNITY)
Admission: EM | Admit: 2013-08-05 | Discharge: 2013-08-05 | Disposition: A | Discharge status: Home / Self Care | Payer: Medicaid Other | Attending: Emergency Medicine | Admitting: Emergency Medicine

## 2013-08-05 DIAGNOSIS — Z79899 Other long term (current) drug therapy: Secondary | ICD-10-CM | POA: Insufficient documentation

## 2013-08-05 DIAGNOSIS — N898 Other specified noninflammatory disorders of vagina: Secondary | ICD-10-CM | POA: Insufficient documentation

## 2013-08-05 DIAGNOSIS — M549 Dorsalgia, unspecified: Secondary | ICD-10-CM | POA: Insufficient documentation

## 2013-08-05 DIAGNOSIS — Z3201 Encounter for pregnancy test, result positive: Secondary | ICD-10-CM | POA: Insufficient documentation

## 2013-08-05 DIAGNOSIS — Z87891 Personal history of nicotine dependence: Secondary | ICD-10-CM | POA: Insufficient documentation

## 2013-08-05 DIAGNOSIS — Y9389 Activity, other specified: Secondary | ICD-10-CM | POA: Insufficient documentation

## 2013-08-05 DIAGNOSIS — Y92410 Unspecified street and highway as the place of occurrence of the external cause: Secondary | ICD-10-CM | POA: Insufficient documentation

## 2013-08-05 DIAGNOSIS — Z331 Pregnant state, incidental: Secondary | ICD-10-CM | POA: Insufficient documentation

## 2013-08-05 DIAGNOSIS — R109 Unspecified abdominal pain: Secondary | ICD-10-CM | POA: Insufficient documentation

## 2013-08-05 LAB — WET PREP, GENITAL
Clue Cells Wet Prep HPF POC: NONE SEEN
Trich, Wet Prep: NONE SEEN
Yeast Wet Prep HPF POC: NONE SEEN

## 2013-08-05 LAB — URINALYSIS, ROUTINE W REFLEX MICROSCOPIC
Bilirubin Urine: NEGATIVE
Glucose, UA: NEGATIVE mg/dL
Hgb urine dipstick: NEGATIVE
Protein, ur: NEGATIVE mg/dL

## 2013-08-05 LAB — POCT PREGNANCY, URINE: Preg Test, Ur: POSITIVE — AB
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
## 2013-08-28 ENCOUNTER — Inpatient Hospital Stay (HOSPITAL_COMMUNITY)
Admission: AD | Admit: 2013-08-28 | Discharge: 2013-08-28 | Disposition: A | Discharge status: Home / Self Care | Payer: Medicaid Other | Source: Ambulatory Visit | Attending: Obstetrics and Gynecology | Admitting: Obstetrics and Gynecology

## 2013-08-28 ENCOUNTER — Encounter (HOSPITAL_COMMUNITY): Payer: Self-pay | Admitting: *Deleted

## 2013-08-28 DIAGNOSIS — N390 Urinary tract infection, site not specified: Secondary | ICD-10-CM | POA: Insufficient documentation

## 2013-08-28 DIAGNOSIS — N949 Unspecified condition associated with female genital organs and menstrual cycle: Secondary | ICD-10-CM | POA: Insufficient documentation

## 2013-08-28 DIAGNOSIS — R300 Dysuria: Secondary | ICD-10-CM | POA: Insufficient documentation

## 2013-08-28 LAB — URINALYSIS, ROUTINE W REFLEX MICROSCOPIC
Glucose, UA: NEGATIVE mg/dL
Ketones, ur: 15 mg/dL — AB
Protein, ur: NEGATIVE mg/dL

## 2013-08-28 LAB — URINE MICROSCOPIC-ADD ON

## 2013-08-28 MED ORDER — PHENAZOPYRIDINE HCL 97.5 MG PO TABS: 2.0000 | ORAL_TABLET | Freq: Three times a day (TID) | ORAL | Status: DC | PRN

## 2013-08-28 MED ORDER — NITROFURANTOIN MONOHYD MACRO 100 MG PO CAPS: 100.0000 mg | ORAL_CAPSULE | Freq: Two times a day (BID) | ORAL | Status: DC

## 2013-08-28 NOTE — MAU Provider Note (Signed)
Kristen Warner is a 27 y.o. female presenting for acute dysuria this morning at 20+6 weeks. Reports increased frequency for the last week. Denies contractions, vaginal bleeding or leaking of fluid. Also increased vaginal discharge without odor or itching.  Was treated in July for GBS bacteriuria with Penicillin for 7 days. Urine culture 08/08/13 was insignificant growth without GBS.  Patient has no known drug allergy.  OB History   Grav Para Term Preterm Abortions TAB SAB Ect Mult Living   4 3 1 2      3      Past Medical History  Diagnosis Date  . Bronchitis   . Complication of anesthesia    Past Surgical History  Procedure Laterality Date  . Cesarean section        Blood pressure 125/63, pulse 75, temperature 98.1 F (36.7 C), temperature source Oral, resp. rate 16, height 5\' 5"  (1.651 m), weight 246 lb 3.2 oz (111.676 kg), last menstrual period 04/04/2013, SpO2 100.00%.  General Appearance: Alert, appropriate appearance for age. No acute distress HEENT Exam: Grossly normal Gastrointestinal Exam: soft, non-tender, Uterus gravid with size compatible with GA Psychiatric Exam: Alert and oriented, appropriate affect  U/A: Results for ELBONY, MCCLIMANS (MRN [id number]) as of 08/28/2013 10:39  Ref. Range 08/28/2013 09:26  Color, Urine Latest Range: YELLOW  YELLOW  APPearance Latest Range: CLEAR  CLOUDY (A)  Specific Gravity, Urine Latest Range: 1.005-1.030  >1.030 (H)  pH Latest Range: 5.0-8.0  6.0  Glucose Latest Range: NEGATIVE mg/dL NEGATIVE  Bilirubin Urine Latest Range: NEGATIVE  SMALL (A)  Ketones, ur Latest Range: NEGATIVE mg/dL 15 (A)  Protein Latest Range: NEGATIVE mg/dL NEGATIVE  Urobilinogen, UA Latest Range: 0.0-1.0 mg/dL 0.2  Nitrite Latest Range: NEGATIVE  NEGATIVE  Leukocytes, UA Latest Range: NEGATIVE  MODERATE (A)  Hgb urine dipstick Latest Range: NEGATIVE  LARGE (A)  Urine-Other No range found MUCOUS PRESENT  WBC, UA Latest Range: <3 WBC/hpf TOO NUMEROUS TO COUNT  RBC  / HPF Latest Range: <3 RBC/hpf 11-20  Squamous Epithelial / LPF Latest Range: RARE  MANY (A)  Bacteria, UA Latest Range: RARE  RARE    ++++++++++++++++++++++++++++++++++++++++++++++++++++++++++++++++  Vaginal exam: VV normal. Minimal white discharge. Cervix is long/closed/ high presentation. Wet prep was collected   ++++++++++++++++++++++++++++++++++++++++++++++++++++++++++++++++  Assessment and plan:  Probable UTI Macrobid 100 mg BID for 7 days with AZO Patient to call if worsening of symptoms or fever Next appointment in office: 09/02/13  Silverio Lay MD

## 2013-08-28 NOTE — MAU Note (Signed)
Patient states she has been diagnosed with GBS in her urine and has not been treated. States she has had pain with urination for about one week and worse this am. Now causing abdominal and pelvic pain. Feeling fetal movement. Denies vaginal bleeding but does have a heavy vaginal discharge.

## 2013-08-29 LAB — URINE CULTURE: Colony Count: 3000

## 2013-09-01 ENCOUNTER — Inpatient Hospital Stay (HOSPITAL_COMMUNITY)
Admission: AD | Admit: 2013-09-01 | Discharge: 2013-09-01 | Disposition: A | Discharge status: Home / Self Care | Payer: Medicaid Other | Source: Ambulatory Visit | Attending: Obstetrics and Gynecology | Admitting: Obstetrics and Gynecology

## 2013-09-01 ENCOUNTER — Encounter (HOSPITAL_COMMUNITY): Payer: Self-pay | Admitting: *Deleted

## 2013-09-01 DIAGNOSIS — N949 Unspecified condition associated with female genital organs and menstrual cycle: Secondary | ICD-10-CM | POA: Insufficient documentation

## 2013-09-01 DIAGNOSIS — O139 Gestational [pregnancy-induced] hypertension without significant proteinuria, unspecified trimester: Secondary | ICD-10-CM | POA: Diagnosis not present

## 2013-09-01 DIAGNOSIS — E669 Obesity, unspecified: Secondary | ICD-10-CM | POA: Diagnosis present

## 2013-09-01 DIAGNOSIS — O99891 Other specified diseases and conditions complicating pregnancy: Secondary | ICD-10-CM | POA: Insufficient documentation

## 2013-09-01 DIAGNOSIS — O09899 Supervision of other high risk pregnancies, unspecified trimester: Secondary | ICD-10-CM

## 2013-09-01 DIAGNOSIS — O34219 Maternal care for unspecified type scar from previous cesarean delivery: Secondary | ICD-10-CM | POA: Diagnosis not present

## 2013-09-01 DIAGNOSIS — B951 Streptococcus, group B, as the cause of diseases classified elsewhere: Secondary | ICD-10-CM | POA: Diagnosis not present

## 2013-09-01 HISTORY — DX: Gestational (pregnancy-induced) hypertension without significant proteinuria, unspecified trimester: O13.9

## 2013-09-01 HISTORY — DX: Unspecified infectious disease: B99.9

## 2013-09-01 LAB — URINALYSIS, ROUTINE W REFLEX MICROSCOPIC
Glucose, UA: NEGATIVE mg/dL
Hgb urine dipstick: NEGATIVE
Leukocytes, UA: NEGATIVE
Specific Gravity, Urine: 1.02 (ref 1.005–1.030)
Urobilinogen, UA: 0.2 mg/dL (ref 0.0–1.0)

## 2013-09-01 MED ORDER — IBUPROFEN 600 MG PO TABS: 600.0000 mg | ORAL_TABLET | Freq: Four times a day (QID) | ORAL | Status: DC | PRN

## 2013-09-01 MED ORDER — IBUPROFEN 600 MG PO TABS
600.0000 mg | ORAL_TABLET | Freq: Four times a day (QID) | ORAL | Status: DC | PRN
  Administered 2013-09-01: 600 mg via ORAL
  Filled 2013-09-01: qty 1

## 2013-09-01 NOTE — MAU Note (Signed)
Pt presents with complaints of pelvic pain with pressure. States that she is being treated for a UTI and has only 2 days left on her prescription.

## 2013-09-01 NOTE — MAU Provider Note (Signed)
History   27 yo [id number] at 72 3/7 weeks presented unannounced c/o pelvic pain since last night--went to outdoor event that required walking in woods, with onset of pelvic pain after that.  Primarily in pubic bone area, with increased pain with movement, walking, and turning over in bed.  Denies leaking, bleeding, or dysuria.  Seen in MAU 9/17 for UTI sx--Rx'd with Macrobid x 7 days, 2 days more to complete course.  No back pain, fever, or any other issues.  Patient Active Problem List   Diagnosis Date Noted  . GBS (group B streptococcus) UTI complicating pregnancy 09/01/2013  . Previous cesarean delivery x 2--prior vaginal delivery, desires VBAC. 09/01/2013  . Gestational hypertension with 1st pregnancy 09/01/2013  . Obesity, unspecified 09/01/2013  . Hx of preterm delivery, currently pregnant 09/01/2013  ? PTL vs elective delivery by C/S at 36 weeks.   Chief Complaint  Patient presents with  . Pelvic Pain     OB History   Grav Para Term Preterm Abortions TAB SAB Ect Mult Living   4 3 1 2      3       Past Medical History  Diagnosis Date  . Bronchitis   . Complication of anesthesia   . Preterm labor   . Pregnancy induced hypertension     induced for PIH with first  . Infection     UTI    Past Surgical History  Procedure Laterality Date  . Cesarean section      Family History  Problem Relation Age of Onset  . Hypertension Mother   . Heart disease Father   . Diabetes Sister   . Heart disease Paternal Grandfather   . Heart disease Paternal Grandmother     History  Substance Use Topics  . Smoking status: Former Games developer  . Smokeless tobacco: Never Used     Comment: quit 5 yrs ago  . Alcohol Use: No    Allergies: No Known Allergies  No prescriptions prior to admission     Physical Exam   Blood pressure 124/67, pulse 66, temperature 97.8 F (36.6 C), temperature source Oral, resp. rate 16, last menstrual period 04/04/2013.  Chest clear Heart RRR without  murmur Abd gravid, NT--mild tenderness over pubic symphysis Pelvic--cervix closed and long, adequate pelvis, no d/c in vault. Ext WNL Back--neg CVAT  FHR 154 by doppler No contractions by report or palpation  ED Course  IUP at 21 3/7 weeks Musculoskeletal pelvic pain  Plan: Reassured regarding status of cervix and baby. Comfort measures reviewed for pubic bone/pelvic musculoskeletal pain (Ibuprophen, pregnancy support belt, rest) Patient tearful, due to multiple demands on her time (3 young children, patient in college)--feels she can't have any issues that "slow her down".  Issues reviewed.  Support offered. Encouraged to rest today, and to increase as able. Rx Ibuprophen 600 mg po q 6 hours prn. F/U at scheduled visit tomorrow or prn.   Nigel Bridgeman CNM, [place] 09/01/2013 2:16 PM

## 2013-12-12 ENCOUNTER — Encounter (HOSPITAL_COMMUNITY): Payer: Self-pay | Admitting: *Deleted

## 2013-12-12 ENCOUNTER — Inpatient Hospital Stay (HOSPITAL_COMMUNITY)
Admission: AD | Admit: 2013-12-12 | Discharge: 2013-12-12 | Disposition: A | Discharge status: Home / Self Care | Payer: Medicaid Other | Source: Ambulatory Visit | Attending: Obstetrics and Gynecology | Admitting: Obstetrics and Gynecology

## 2013-12-12 DIAGNOSIS — N898 Other specified noninflammatory disorders of vagina: Secondary | ICD-10-CM | POA: Insufficient documentation

## 2013-12-12 DIAGNOSIS — O99891 Other specified diseases and conditions complicating pregnancy: Secondary | ICD-10-CM | POA: Insufficient documentation

## 2013-12-12 DIAGNOSIS — O9989 Other specified diseases and conditions complicating pregnancy, childbirth and the puerperium: Principal | ICD-10-CM

## 2013-12-12 DIAGNOSIS — IMO0002 Reserved for concepts with insufficient information to code with codable children: Secondary | ICD-10-CM | POA: Insufficient documentation

## 2013-12-12 DIAGNOSIS — Z87891 Personal history of nicotine dependence: Secondary | ICD-10-CM | POA: Insufficient documentation

## 2013-12-12 LAB — URINALYSIS, ROUTINE W REFLEX MICROSCOPIC
BILIRUBIN URINE: NEGATIVE
GLUCOSE, UA: NEGATIVE mg/dL
HGB URINE DIPSTICK: NEGATIVE
Ketones, ur: 15 mg/dL — AB
Leukocytes, UA: NEGATIVE
Nitrite: NEGATIVE
PROTEIN: NEGATIVE mg/dL
Specific Gravity, Urine: >1.03 — ABNORMAL HIGH (ref 1.005–1.030)
Urobilinogen, UA: 0.2 mg/dL (ref 0.0–1.0)
pH: 6 (ref 5.0–8.0)

## 2013-12-12 LAB — OB RESULTS CONSOLE GC/CHLAMYDIA
Chlamydia: NEGATIVE
Gonorrhea: NEGATIVE

## 2013-12-12 LAB — WET PREP, GENITAL
CLUE CELLS WET PREP: NONE SEEN
TRICH WET PREP: NONE SEEN

## 2013-12-12 MED ORDER — TERCONAZOLE 0.4 % VA CREA: 1.0000 | TOPICAL_CREAM | Freq: Every day | VAGINAL | Status: AC

## 2013-12-12 NOTE — MAU Provider Note (Signed)
History   28 yo, [id number] at 3163w0d presents to MAU for vaginal discharge and swelling in hands and feet.  Pt describes discharge as slightly green and "snot-like", in addition to her regular white discharge.  This discharge began a couple of days ago. Pt denies vaginal itching, irritation or odor.  Pt states that she puts her feet up as often as she can.  History of PTD Preterm 06/10/06 SVD  Term 07/31/07  LTCS  Preterm 07/18/08  LTCS  Chief Complaint  Patient presents with  . Vaginal Discharge  . Leg Swelling   HPI  OB History   Grav Para Term Preterm Abortions TAB SAB Ect Mult Living   4 3 1 2      3       Past Medical History  Diagnosis Date  . Bronchitis   . Complication of anesthesia   . Preterm labor   . Pregnancy induced hypertension     induced for PIH with first  . Infection     UTI    Past Surgical History  Procedure Laterality Date  . Cesarean section      Family History  Problem Relation Age of Onset  . Hypertension Mother   . Heart disease Father   . Diabetes Sister   . Heart disease Paternal Grandfather   . Heart disease Paternal Grandmother     History  Substance Use Topics  . Smoking status: Former Games developermoker  . Smokeless tobacco: Never Used     Comment: quit 5 yrs ago  . Alcohol Use: No    Allergies: No Known Allergies  Prescriptions prior to admission  Medication Sig Dispense Refill  . Doxylamine-Pyridoxine (DICLEGIS) 10-10 MG TBEC Take 1-3 tablets by mouth 2 (two) times daily. 1 tablet in the am and 2 tablets at night.      Marland Kitchen. ibuprofen (ADVIL,MOTRIN) 600 MG tablet Take 1 tablet (600 mg total) by mouth every 6 (six) hours as needed (pelvic pain).  30 tablet  0  . nitrofurantoin, macrocrystal-monohydrate, (MACROBID) 100 MG capsule Take 1 capsule (100 mg total) by mouth 2 (two) times daily.  14 capsule  0  . Phenazopyridine HCl 97.5 MG TABS Take 2 tablets (195 mg total) by mouth 3 (three) times daily as needed.  12 each  0  . Prenatal Vit-Fe  Fumarate-FA (PRENATAL MULTIVITAMIN) TABS Take 1 tablet by mouth daily at 12 noon.        ROS ROS: see HPI above, all other systems are negative  Physical Exam   Blood pressure 119/73, pulse 100, temperature 98.3 F (36.8 C), temperature source Oral, resp. rate 16, height 5\' 5"  (1.651 m), weight 255 lb 3.2 oz (115.758 kg), last menstrual period 04/04/2013, SpO2 100.00%.  Physical Exam  Constitutional: She is oriented to person, place, and time. She appears well-developed.  Cardiovascular: Normal rate.   Respiratory: Effort normal.  GI: Soft. There is no tenderness.  Musculoskeletal: Normal range of motion.  Neurological: She is alert and oriented to person, place, and time.  Skin: Skin is warm and dry.  Psychiatric: She has a normal mood and affect.  Pelvic exam: normal external genitalia, vulva, vagina, cervix, uterus and adnexa.  Small amount of mucousy discharge noted at cervical os Cervix: 1 cm / 20 % / -2  ED Course  IUP at 5963w0d Vaginal discharge  Wet prep - yeast GC/CT - pending  Terazol 7 D/c home with precautions F/u 1/6 at an already scheduled [facility]     Tazia Illescas  CNM, MSN 12/12/2013 2:33 PM

## 2013-12-12 NOTE — Discharge Instructions (Signed)
Monilial Vaginitis  Vaginitis in a soreness, swelling and redness (inflammation) of the vagina and vulva. Monilial vaginitis is not a sexually transmitted infection.  CAUSES   Yeast vaginitis is caused by yeast (candida) that is normally found in your vagina. With a yeast infection, the candida has overgrown in number to a point that upsets the chemical balance.  SYMPTOMS   · White, thick vaginal discharge.  · Swelling, itching, redness and irritation of the vagina and possibly the lips of the vagina (vulva).  · Burning or painful urination.  · Painful intercourse.  DIAGNOSIS   Things that may contribute to monilial vaginitis are:  · Postmenopausal and virginal states.  · Pregnancy.  · Infections.  · Being tired, sick or stressed, especially if you had monilial vaginitis in the past.  · Diabetes. Good control will help lower the chance.  · Birth control pills.  · Tight fitting garments.  · Using bubble bath, feminine sprays, douches or deodorant tampons.  · Taking certain medications that kill germs (antibiotics).  · Sporadic recurrence can occur if you become ill.  TREATMENT   Your caregiver will give you medication.  · There are several kinds of anti monilial vaginal creams and suppositories specific for monilial vaginitis. For recurrent yeast infections, use a suppository or cream in the vagina 2 times a week, or as directed.  · Anti-monilial or steroid cream for the itching or irritation of the vulva may also be used. Get your caregiver's permission.  · Painting the vagina with methylene blue solution may help if the monilial cream does not work.  · Eating yogurt may help prevent monilial vaginitis.  HOME CARE INSTRUCTIONS   · Finish all medication as prescribed.  · Do not have sex until treatment is completed or after your caregiver tells you it is okay.  · Take warm sitz baths.  · Do not douche.  · Do not use tampons, especially scented ones.  · Wear cotton underwear.  · Avoid tight pants and panty  hose.  · Tell your sexual partner that you have a yeast infection. They should go to their caregiver if they have symptoms such as mild rash or itching.  · Your sexual partner should be treated as well if your infection is difficult to eliminate.  · Practice safer sex. Use condoms.  · Some vaginal medications cause latex condoms to fail. Vaginal medications that harm condoms are:  · Cleocin cream.  · Butoconazole (Femstat®).  · Terconazole (Terazol®) vaginal suppository.  · Miconazole (Monistat®) (may be purchased over the counter).  SEEK MEDICAL CARE IF:   · You have a temperature by mouth above 102° F (38.9° C).  · The infection is getting worse after 2 days of treatment.  · The infection is not getting better after 3 days of treatment.  · You develop blisters in or around your vagina.  · You develop vaginal bleeding, and it is not your menstrual period.  · You have pain when you urinate.  · You develop intestinal problems.  · You have pain with sexual intercourse.  Document Released: 09/07/2005 Document Revised: 02/20/2012 Document Reviewed: 05/22/2009  ExitCare® Patient Information ©2014 [employer].

## 2013-12-12 NOTE — L&D Delivery Note (Signed)
Delivery Note At 6:32 PM a viable female, "Kristen Warner", was delivered via VBAC, Spontaneous (Presentation: Left Occiput Anterior).  APGAR: 8, 9; weight .   Placenta status: Intact, Spontaneous.  Cord: 3 vessels with the following complications: CAN x 1, delivered through via somersault maneuver.  Cord pH: NA  Anesthesia: Epidural  Episiotomy: None Lacerations: 1st degree left vaginal Mandley laceration, 1st degree right periurethral laceration. Suture Repair: 2.0 3.0 vicryl rapide Est. Blood Loss (mL): 250 cc  Mom to postpartum.  Baby to Couplet care / Skin to Skin.  [name], [name] 01/12/2014, 7:26 PM

## 2013-12-12 NOTE — MAU Note (Signed)
Patient states she started leaking a thick yellow/greenish fluid 2 days ago. States last week she had a lot of swelling that has continues. Has an occasional contraction when standing. Reports good fetal movement.

## 2013-12-15 LAB — GC/CHLAMYDIA PROBE AMP
CT PROBE, AMP APTIMA: NEGATIVE
GC PROBE AMP APTIMA: NEGATIVE

## 2013-12-22 ENCOUNTER — Inpatient Hospital Stay (HOSPITAL_COMMUNITY)
Admission: AD | Admit: 2013-12-22 | Discharge: 2013-12-22 | Disposition: A | Discharge status: Home / Self Care | Payer: Medicaid Other | Source: Ambulatory Visit | Attending: Obstetrics and Gynecology | Admitting: Obstetrics and Gynecology

## 2013-12-22 ENCOUNTER — Encounter (HOSPITAL_COMMUNITY): Payer: Self-pay

## 2013-12-22 DIAGNOSIS — O9989 Other specified diseases and conditions complicating pregnancy, childbirth and the puerperium: Principal | ICD-10-CM

## 2013-12-22 DIAGNOSIS — R109 Unspecified abdominal pain: Secondary | ICD-10-CM | POA: Insufficient documentation

## 2013-12-22 DIAGNOSIS — Z22330 Carrier of Group B streptococcus: Secondary | ICD-10-CM | POA: Insufficient documentation

## 2013-12-22 DIAGNOSIS — O99891 Other specified diseases and conditions complicating pregnancy: Secondary | ICD-10-CM | POA: Insufficient documentation

## 2013-12-22 DIAGNOSIS — O34219 Maternal care for unspecified type scar from previous cesarean delivery: Secondary | ICD-10-CM | POA: Insufficient documentation

## 2013-12-22 NOTE — MAU Provider Note (Signed)
History   28 yo [id number] at 37 3/7 weeks presented unannounced c/o pelvic pressure and cramping since this afternoon.  Denies leaking, bleeding, or dysuria.  Reports +FM.  Cervix has been 3 cm, 50% on office exam last week.  Patient Active Problem List   Diagnosis Date Noted  . GBS (group B streptococcus) UTI complicating pregnancy 09/01/2013  . Previous cesarean delivery x 2--prior vaginal delivery, desires VBAC. 09/01/2013  . Gestational hypertension with 1st pregnancy 09/01/2013  . Obesity, unspecified 09/01/2013  . Hx of preterm delivery, currently pregnant 09/01/2013     Chief Complaint  Patient presents with  . Abdominal Cramping   HPI:  See above  OB History   Grav Para Term Preterm Abortions TAB SAB Ect Mult Living   4 3 1 2      3       Past Medical History  Diagnosis Date  . Bronchitis   . Complication of anesthesia   . Preterm labor   . Pregnancy induced hypertension     induced for PIH with first  . Infection     UTI    Past Surgical History  Procedure Laterality Date  . Cesarean section      Family History  Problem Relation Age of Onset  . Hypertension Mother   . Heart disease Father   . Diabetes Sister   . Heart disease Paternal Grandfather   . Heart disease Paternal Grandmother     History  Substance Use Topics  . Smoking status: Former Games developermoker  . Smokeless tobacco: Never Used     Comment: quit 5 yrs ago  . Alcohol Use: No    Allergies: No Known Allergies  Prescriptions prior to admission  Medication Sig Dispense Refill  . Doxylamine-Pyridoxine (DICLEGIS) 10-10 MG TBEC Take 1-3 tablets by mouth 2 (two) times daily. 1 tablet in the am and 2 tablets at night.      . Prenatal Vit-Fe Fumarate-FA (PRENATAL MULTIVITAMIN) TABS Take 1 tablet by mouth daily at 12 noon.        ROS:  Pelvic bone pain, cramping, +FM Physical Exam   Blood pressure 144/77, pulse 85, temperature 98.1 F (36.7 C), temperature source Oral, resp. rate 18, last  menstrual period 04/04/2013.  Physical Exam Chest clear Heart RRR without murmur Abd gravid, NT Pelvic--3 cm, 50%, firm, vtx, -2 Ext WNL  FHR Category 1 UCs none, just mild irritability.   ED Course  IUP at 37 3/7 weeks Discomforts of 3rd trimester  Plan: D/C home Comfort measures reviewed. Keep scheduled visit at [facility] on 12/25/13, or call prn.    [name], Tyasia Packard CNM, [place] 12/22/2013 4:49 PM

## 2013-12-22 NOTE — MAU Note (Signed)
Pt presents complaining of increasing pressure and abdominal cramping that began today. Denies vaginal bleeding and loss of fluid.

## 2013-12-22 NOTE — Discharge Instructions (Signed)

## 2013-12-27 ENCOUNTER — Inpatient Hospital Stay (HOSPITAL_COMMUNITY)
Admission: AD | Admit: 2013-12-27 | Discharge: 2013-12-27 | Disposition: A | Discharge status: Home / Self Care | Payer: Medicaid Other | Source: Ambulatory Visit | Attending: Obstetrics and Gynecology | Admitting: Obstetrics and Gynecology

## 2013-12-27 ENCOUNTER — Encounter (HOSPITAL_COMMUNITY): Payer: Self-pay | Admitting: *Deleted

## 2013-12-27 DIAGNOSIS — O99891 Other specified diseases and conditions complicating pregnancy: Secondary | ICD-10-CM | POA: Insufficient documentation

## 2013-12-27 DIAGNOSIS — O34219 Maternal care for unspecified type scar from previous cesarean delivery: Secondary | ICD-10-CM | POA: Insufficient documentation

## 2013-12-27 DIAGNOSIS — Z22330 Carrier of Group B streptococcus: Secondary | ICD-10-CM | POA: Insufficient documentation

## 2013-12-27 DIAGNOSIS — O9989 Other specified diseases and conditions complicating pregnancy, childbirth and the puerperium: Secondary | ICD-10-CM

## 2013-12-27 DIAGNOSIS — O212 Late vomiting of pregnancy: Secondary | ICD-10-CM | POA: Insufficient documentation

## 2013-12-27 LAB — URINALYSIS, ROUTINE W REFLEX MICROSCOPIC
Bilirubin Urine: NEGATIVE
Glucose, UA: NEGATIVE mg/dL
Hgb urine dipstick: NEGATIVE
Ketones, ur: NEGATIVE mg/dL
Leukocytes, UA: NEGATIVE
Nitrite: NEGATIVE
Protein, ur: NEGATIVE mg/dL
Specific Gravity, Urine: 1.025 (ref 1.005–1.030)
Urobilinogen, UA: 0.2 mg/dL (ref 0.0–1.0)
pH: 7 (ref 5.0–8.0)

## 2013-12-27 MED ORDER — ONDANSETRON HCL 4 MG/2ML IJ SOLN
4.0000 mg | Freq: Once | INTRAMUSCULAR | Status: AC
  Administered 2013-12-27: 4 mg via INTRAVENOUS
  Filled 2013-12-27: qty 2

## 2013-12-27 MED ORDER — LACTATED RINGERS IV BOLUS (SEPSIS)
500.0000 mL | Freq: Once | INTRAVENOUS | Status: AC
  Administered 2013-12-27: 500 mL via INTRAVENOUS

## 2013-12-27 MED ORDER — LACTATED RINGERS IV SOLN: INTRAVENOUS | Status: DC

## 2013-12-27 MED ORDER — PANTOPRAZOLE SODIUM 40 MG PO TBEC: 40.0000 mg | DELAYED_RELEASE_TABLET | Freq: Every day | ORAL | Status: DC

## 2013-12-27 MED ORDER — PANTOPRAZOLE SODIUM 40 MG IV SOLR
40.0000 mg | Freq: Once | INTRAVENOUS | Status: AC
  Administered 2013-12-27: 40 mg via INTRAVENOUS
  Filled 2013-12-27: qty 40

## 2013-12-27 NOTE — Discharge Instructions (Signed)
Heartburn During Pregnancy ° Heartburn happens when stomach acid goes up into the esophagus. The esophagus is the tube between the mouth and the stomach. This acid causes a burning pain in the chest or throat. This happens more often in the later part of pregnancy because the womb (uterus) gets larger. It may also happen because of hormone changes. Heartburn problems often go away after giving birth. °HOME CARE °· Take all medicine as told by your doctor. °· Raise the head of your bed with blocks only as told by your doctor. °· Do not exercise right after eating. °· Avoid eating 2 3 hours before bed. Do not lie down right after eating. °· Eat small meals throughout the day instead of 3 large meals. °· Avoid foods that give you heartburn. Foods you may want to avoid include: °· Peppers. °· Chocolate. °· High-fat foods, including fried foods. °· Spicy foods. °· Garlic and onions. °· Citrus fruits, including oranges, grapefruit, lemons, and limes. °· Food containing tomatoes or tomato products. °· Mint. °· Bubbly (carbonated) drinks and drinks with caffeine. °· Vinegar. °GET HELP IF: °· You have any belly (abdominal) pain. °· You feel burning in your upper belly or chest, especially after eating or lying down. °· You feel sick to your stomach (nauseous) and throw up (vomit). °· Your stomach feels upset after you eat. °GET HELP RIGHT AWAY IF: °· You have bad chest pain that goes down your arm or into your jaw or neck. °· You feel sweaty, dizzy, or lightheaded. °· You have trouble breathing. °· You throw up blood. °· You have trouble or pain when swallowing. °· You have bloody or black poop (stool). °· You have heartburn more than 3 times a week, for more than 2 weeks. °MAKE SURE YOU: °· Understand these instructions. °· Will watch your condition. °· Will get help right away if you are not doing well or get worse. °Document Released: 12/31/2010 Document Revised: 09/18/2013 Document Reviewed: 07/17/2013 °ExitCare® Patient  Information ©2014 [employer]. ° °

## 2013-12-27 NOTE — MAU Note (Signed)
Patient presents to MAU with c/o nausea, vomiting, and heartburn since yesterday; patient states unable to keep fluids down.  Denies LOF, VB, or contractions at this time. Reports good fetal movement.

## 2013-12-27 NOTE — MAU Provider Note (Signed)
History   28 yo [id number] at 33 1/7 weeks presented after calling office to report N/V x 24 hours, with inability to keep f/f down.  Has struggled with N/V during the pregnancy, on Diclegis at home.  Denies abdominal pain, fever, leaking, bleeding, contractions, decreased FM, viral exposures, or any other issues.    Patient Active Problem List   Diagnosis Date Noted  . GBS (group B streptococcus) UTI complicating pregnancy 09/01/2013  . Previous cesarean delivery x 2--prior vaginal delivery, desires VBAC. 09/01/2013  . Gestational hypertension with 1st pregnancy 09/01/2013  . Obesity, unspecified 09/01/2013  . Hx of preterm delivery, currently pregnant 09/01/2013     Chief Complaint  Patient presents with  . Nausea  . Vomiting   HPI:  See above  OB History   Grav Para Term Preterm Abortions TAB SAB Ect Mult Living   4 3 1 2      3       Past Medical History  Diagnosis Date  . Bronchitis   . Complication of anesthesia   . Preterm labor   . Pregnancy induced hypertension     induced for PIH with first  . Infection     UTI    Past Surgical History  Procedure Laterality Date  . Cesarean section      Family History  Problem Relation Age of Onset  . Hypertension Mother   . Heart disease Father   . Diabetes Sister   . Heart disease Paternal Grandfather   . Heart disease Paternal Grandmother     History  Substance Use Topics  . Smoking status: Former Games developer  . Smokeless tobacco: Never Used     Comment: quit 5 yrs ago  . Alcohol Use: No    Allergies: No Known Allergies  Prescriptions prior to admission  Medication Sig Dispense Refill  . Doxylamine-Pyridoxine (DICLEGIS) 10-10 MG TBEC Take 1-3 tablets by mouth 2 (two) times daily. 1 tablet in the am and 2 tablets at night.      . Prenatal Vit-Fe Fumarate-FA (PRENATAL MULTIVITAMIN) TABS Take 1 tablet by mouth daily at 12 noon.        ROS:  N/V last 24 hours, +FM Physical Exam   Blood pressure 131/72, pulse 94,  temperature 98.2 F (36.8 C), temperature source Oral, resp. rate 18, height 5\' 5"  (1.651 m), weight 259 lb 9.6 oz (117.754 kg), last menstrual period 04/04/2013, SpO2 100.00%.  Physical Exam In NAD--no N/V last 2-3 hours. Chest clear Heart RRR without murmur Abd gravid, NT Pelvic--deferred Ext WNL  FHR Category 1 UCs very occasional  Results for orders placed during the hospital encounter of 12/27/13 (from the past 24 hour(s))  URINALYSIS, ROUTINE W REFLEX MICROSCOPIC     Status: None   Collection Time    12/27/13  2:45 PM      Result Value Range   Color, Urine YELLOW  YELLOW   APPearance CLEAR  CLEAR   Specific Gravity, Urine 1.025  1.005 - 1.030   pH 7.0  5.0 - 8.0   Glucose, UA NEGATIVE  NEGATIVE mg/dL   Hgb urine dipstick NEGATIVE  NEGATIVE   Bilirubin Urine NEGATIVE  NEGATIVE   Ketones, ur NEGATIVE  NEGATIVE mg/dL   Protein, ur NEGATIVE  NEGATIVE mg/dL   Urobilinogen, UA 0.2  0.0 - 1.0 mg/dL   Nitrite NEGATIVE  NEGATIVE   Leukocytes, UA NEGATIVE  NEGATIVE     ED Course  IUP at 38 1/7 weeks N/V--now resolved Previous C/S x  2, planning VBAC\ GBS positive.  Plan: D/C home with instructions for bland diet, use of home antiemetics prn. Labor s/s reviewed. F/u as scheduled at [facility] or prn.   [name], Janie Capp CNM, [place] 12/27/2013 3:30 PM

## 2014-01-01 ENCOUNTER — Encounter (HOSPITAL_COMMUNITY): Payer: Medicaid Other

## 2014-01-01 ENCOUNTER — Other Ambulatory Visit (HOSPITAL_COMMUNITY): Payer: Self-pay | Admitting: Obstetrics and Gynecology

## 2014-01-01 ENCOUNTER — Ambulatory Visit (HOSPITAL_COMMUNITY)
Admission: RE | Admit: 2014-01-01 | Discharge: 2014-01-01 | Disposition: A | Discharge status: Home / Self Care | Payer: Medicaid Other | Source: Ambulatory Visit | Attending: Obstetrics and Gynecology | Admitting: Obstetrics and Gynecology

## 2014-01-01 DIAGNOSIS — R609 Edema, unspecified: Secondary | ICD-10-CM

## 2014-01-01 DIAGNOSIS — M7989 Other specified soft tissue disorders: Secondary | ICD-10-CM

## 2014-01-01 DIAGNOSIS — Z331 Pregnant state, incidental: Secondary | ICD-10-CM | POA: Insufficient documentation

## 2014-01-01 NOTE — Progress Notes (Signed)
VASCULAR LAB PRELIMINARY  PRELIMINARY  PRELIMINARY  PRELIMINARY  Left lower extremity venous duplex completed.    Preliminary report:  Left:  No evidence of DVT, superficial thrombosis, or Baker's cyst.  Galileo Colello, RVT 01/01/2014, 1:34 PM

## 2014-01-12 ENCOUNTER — Inpatient Hospital Stay (HOSPITAL_COMMUNITY): Payer: Medicaid Other | Admitting: Anesthesiology

## 2014-01-12 ENCOUNTER — Inpatient Hospital Stay (HOSPITAL_COMMUNITY)
Admission: AD | Admit: 2014-01-12 | Discharge: 2014-01-14 | DRG: 774 | Disposition: A | Discharge status: Home / Self Care | Payer: Medicaid Other | Source: Ambulatory Visit | Attending: Obstetrics and Gynecology | Admitting: Obstetrics and Gynecology

## 2014-01-12 ENCOUNTER — Encounter (HOSPITAL_COMMUNITY): Payer: Self-pay | Admitting: *Deleted

## 2014-01-12 ENCOUNTER — Encounter (HOSPITAL_COMMUNITY): Payer: Medicaid Other | Admitting: Anesthesiology

## 2014-01-12 DIAGNOSIS — O99892 Other specified diseases and conditions complicating childbirth: Secondary | ICD-10-CM | POA: Diagnosis present

## 2014-01-12 DIAGNOSIS — Z87891 Personal history of nicotine dependence: Secondary | ICD-10-CM

## 2014-01-12 DIAGNOSIS — O4290 Premature rupture of membranes, unspecified as to length of time between rupture and onset of labor, unspecified weeks of gestation: Principal | ICD-10-CM | POA: Diagnosis present

## 2014-01-12 DIAGNOSIS — IMO0002 Reserved for concepts with insufficient information to code with codable children: Secondary | ICD-10-CM | POA: Diagnosis present

## 2014-01-12 DIAGNOSIS — Z22330 Carrier of Group B streptococcus: Secondary | ICD-10-CM

## 2014-01-12 DIAGNOSIS — O212 Late vomiting of pregnancy: Secondary | ICD-10-CM | POA: Diagnosis present

## 2014-01-12 DIAGNOSIS — D649 Anemia, unspecified: Secondary | ICD-10-CM | POA: Diagnosis not present

## 2014-01-12 DIAGNOSIS — O3660X0 Maternal care for excessive fetal growth, unspecified trimester, not applicable or unspecified: Secondary | ICD-10-CM | POA: Diagnosis present

## 2014-01-12 DIAGNOSIS — O34219 Maternal care for unspecified type scar from previous cesarean delivery: Secondary | ICD-10-CM | POA: Diagnosis not present

## 2014-01-12 DIAGNOSIS — O1002 Pre-existing essential hypertension complicating childbirth: Secondary | ICD-10-CM | POA: Diagnosis present

## 2014-01-12 DIAGNOSIS — O9989 Other specified diseases and conditions complicating pregnancy, childbirth and the puerperium: Secondary | ICD-10-CM

## 2014-01-12 DIAGNOSIS — O9903 Anemia complicating the puerperium: Secondary | ICD-10-CM | POA: Diagnosis not present

## 2014-01-12 LAB — CBC
HEMATOCRIT: 32.3 % — AB (ref 36.0–46.0)
Hemoglobin: 11.5 g/dL — ABNORMAL LOW (ref 12.0–15.0)
MCH: 31.4 pg (ref 26.0–34.0)
MCHC: 35.6 g/dL (ref 30.0–36.0)
MCV: 88.3 fL (ref 78.0–100.0)
Platelets: 215 10*3/uL (ref 150–400)
RBC: 3.66 MIL/uL — ABNORMAL LOW (ref 3.87–5.11)
RDW: 13.3 % (ref 11.5–15.5)
WBC: 10.6 10*3/uL — AB (ref 4.0–10.5)

## 2014-01-12 LAB — URINALYSIS, ROUTINE W REFLEX MICROSCOPIC
Bilirubin Urine: NEGATIVE
Glucose, UA: NEGATIVE mg/dL
Hgb urine dipstick: NEGATIVE
Ketones, ur: 15 mg/dL — AB
LEUKOCYTES UA: NEGATIVE
NITRITE: NEGATIVE
PH: 6.5 (ref 5.0–8.0)
Protein, ur: NEGATIVE mg/dL
SPECIFIC GRAVITY, URINE: 1.025 (ref 1.005–1.030)
UROBILINOGEN UA: 0.2 mg/dL (ref 0.0–1.0)

## 2014-01-12 LAB — RPR: RPR Ser Ql: NONREACTIVE

## 2014-01-12 LAB — TYPE AND SCREEN
ABO/RH(D): O POS
Antibody Screen: NEGATIVE

## 2014-01-12 MED ORDER — ONDANSETRON HCL 4 MG/2ML IJ SOLN: 4.0000 mg | INTRAMUSCULAR | Status: DC | PRN

## 2014-01-12 MED ORDER — IBUPROFEN 600 MG PO TABS
600.0000 mg | ORAL_TABLET | Freq: Four times a day (QID) | ORAL | Status: DC
  Administered 2014-01-13 – 2014-01-14 (×6): 600 mg via ORAL
  Filled 2014-01-12 (×6): qty 1

## 2014-01-12 MED ORDER — LACTATED RINGERS IV SOLN
500.0000 mL | Freq: Once | INTRAVENOUS | Status: AC
  Administered 2014-01-12: 500 mL via INTRAVENOUS

## 2014-01-12 MED ORDER — EPHEDRINE 5 MG/ML INJ
10.0000 mg | INTRAVENOUS | Status: DC | PRN
  Filled 2014-01-12: qty 2

## 2014-01-12 MED ORDER — DIPHENHYDRAMINE HCL 25 MG PO CAPS: 25.0000 mg | ORAL_CAPSULE | Freq: Four times a day (QID) | ORAL | Status: DC | PRN

## 2014-01-12 MED ORDER — SENNOSIDES-DOCUSATE SODIUM 8.6-50 MG PO TABS
2.0000 | ORAL_TABLET | ORAL | Status: DC
  Administered 2014-01-13 (×2): 2 via ORAL
  Filled 2014-01-12 (×2): qty 2

## 2014-01-12 MED ORDER — OXYTOCIN 40 UNITS IN LACTATED RINGERS INFUSION - SIMPLE MED: 62.5000 mL/h | INTRAVENOUS | Status: DC

## 2014-01-12 MED ORDER — ZOLPIDEM TARTRATE 5 MG PO TABS: 5.0000 mg | ORAL_TABLET | Freq: Every evening | ORAL | Status: DC | PRN

## 2014-01-12 MED ORDER — OXYTOCIN 40 UNITS IN LACTATED RINGERS INFUSION - SIMPLE MED
1.0000 m[IU]/min | INTRAVENOUS | Status: DC
  Administered 2014-01-12: 2 m[IU]/min via INTRAVENOUS
  Filled 2014-01-12: qty 1000

## 2014-01-12 MED ORDER — PHENYLEPHRINE 40 MCG/ML (10ML) SYRINGE FOR IV PUSH (FOR BLOOD PRESSURE SUPPORT)
80.0000 ug | PREFILLED_SYRINGE | INTRAVENOUS | Status: DC | PRN
  Filled 2014-01-12: qty 2
  Filled 2014-01-12: qty 10

## 2014-01-12 MED ORDER — TETANUS-DIPHTH-ACELL PERTUSSIS 5-2.5-18.5 LF-MCG/0.5 IM SUSP: 0.5000 mL | Freq: Once | INTRAMUSCULAR | Status: DC

## 2014-01-12 MED ORDER — CITRIC ACID-SODIUM CITRATE 334-500 MG/5ML PO SOLN: 30.0000 mL | ORAL | Status: DC | PRN

## 2014-01-12 MED ORDER — ONDANSETRON HCL 4 MG/2ML IJ SOLN
4.0000 mg | Freq: Four times a day (QID) | INTRAMUSCULAR | Status: DC | PRN
  Administered 2014-01-12: 4 mg via INTRAVENOUS
  Filled 2014-01-12: qty 2

## 2014-01-12 MED ORDER — PRENATAL MULTIVITAMIN CH
1.0000 | ORAL_TABLET | Freq: Every day | ORAL | Status: DC
  Administered 2014-01-13: 1 via ORAL
  Filled 2014-01-12: qty 1

## 2014-01-12 MED ORDER — DIBUCAINE 1 % RE OINT: 1.0000 "application " | TOPICAL_OINTMENT | RECTAL | Status: DC | PRN

## 2014-01-12 MED ORDER — LANOLIN HYDROUS EX OINT: TOPICAL_OINTMENT | CUTANEOUS | Status: DC | PRN

## 2014-01-12 MED ORDER — ONDANSETRON HCL 4 MG PO TABS
4.0000 mg | ORAL_TABLET | ORAL | Status: DC | PRN
  Administered 2014-01-13 – 2014-01-14 (×2): 4 mg via ORAL
  Filled 2014-01-12 (×2): qty 1

## 2014-01-12 MED ORDER — LACTATED RINGERS IV SOLN: INTRAVENOUS | Status: DC

## 2014-01-12 MED ORDER — EPHEDRINE 5 MG/ML INJ
10.0000 mg | INTRAVENOUS | Status: DC | PRN
  Filled 2014-01-12: qty 4
  Filled 2014-01-12: qty 2

## 2014-01-12 MED ORDER — PENICILLIN G POTASSIUM 5000000 UNITS IJ SOLR
5.0000 10*6.[IU] | Freq: Once | INTRAVENOUS | Status: AC
  Administered 2014-01-12: 5 10*6.[IU] via INTRAVENOUS
  Filled 2014-01-12: qty 5

## 2014-01-12 MED ORDER — IBUPROFEN 600 MG PO TABS: 600.0000 mg | ORAL_TABLET | Freq: Four times a day (QID) | ORAL | Status: DC | PRN

## 2014-01-12 MED ORDER — LIDOCAINE HCL (PF) 1 % IJ SOLN
INTRAMUSCULAR | Status: DC | PRN
  Administered 2014-01-12 (×2): 4 mL

## 2014-01-12 MED ORDER — ACETAMINOPHEN 325 MG PO TABS
650.0000 mg | ORAL_TABLET | ORAL | Status: DC | PRN
  Administered 2014-01-12: 650 mg via ORAL
  Filled 2014-01-12: qty 2

## 2014-01-12 MED ORDER — FENTANYL 2.5 MCG/ML BUPIVACAINE 1/10 % EPIDURAL INFUSION (WH - ANES)
INTRAMUSCULAR | Status: DC | PRN
  Administered 2014-01-12: 14 mL/h via EPIDURAL

## 2014-01-12 MED ORDER — LIDOCAINE HCL (PF) 1 % IJ SOLN
30.0000 mL | INTRAMUSCULAR | Status: DC | PRN
  Administered 2014-01-12: 30 mL via SUBCUTANEOUS
  Filled 2014-01-12 (×3): qty 30

## 2014-01-12 MED ORDER — PENICILLIN G POTASSIUM 5000000 UNITS IJ SOLR
2.5000 10*6.[IU] | INTRAVENOUS | Status: DC
  Administered 2014-01-12 (×3): 2.5 10*6.[IU] via INTRAVENOUS
  Filled 2014-01-12 (×6): qty 2.5

## 2014-01-12 MED ORDER — OXYCODONE-ACETAMINOPHEN 5-325 MG PO TABS
1.0000 | ORAL_TABLET | ORAL | Status: DC | PRN
  Administered 2014-01-13: 2 via ORAL
  Administered 2014-01-13: 1 via ORAL
  Administered 2014-01-13: 2 via ORAL
  Administered 2014-01-13: 1 via ORAL
  Administered 2014-01-13: 2 via ORAL
  Administered 2014-01-13: 1 via ORAL
  Administered 2014-01-14 (×2): 2 via ORAL
  Filled 2014-01-12: qty 1
  Filled 2014-01-12 (×2): qty 2
  Filled 2014-01-12 (×2): qty 1
  Filled 2014-01-12 (×3): qty 2

## 2014-01-12 MED ORDER — BENZOCAINE-MENTHOL 20-0.5 % EX AERO
1.0000 "application " | INHALATION_SPRAY | CUTANEOUS | Status: DC | PRN
  Administered 2014-01-13: 1 via TOPICAL
  Filled 2014-01-12 (×2): qty 56

## 2014-01-12 MED ORDER — FLEET ENEMA 7-19 GM/118ML RE ENEM: 1.0000 | ENEMA | RECTAL | Status: DC | PRN

## 2014-01-12 MED ORDER — WITCH HAZEL-GLYCERIN EX PADS: 1.0000 "application " | MEDICATED_PAD | CUTANEOUS | Status: DC | PRN

## 2014-01-12 MED ORDER — DIPHENHYDRAMINE HCL 50 MG/ML IJ SOLN: 12.5000 mg | INTRAMUSCULAR | Status: DC | PRN

## 2014-01-12 MED ORDER — OXYTOCIN BOLUS FROM INFUSION: 500.0000 mL | INTRAVENOUS | Status: DC

## 2014-01-12 MED ORDER — OXYCODONE-ACETAMINOPHEN 5-325 MG PO TABS: 1.0000 | ORAL_TABLET | ORAL | Status: DC | PRN

## 2014-01-12 MED ORDER — FENTANYL CITRATE 0.05 MG/ML IJ SOLN: 100.0000 ug | INTRAMUSCULAR | Status: DC | PRN

## 2014-01-12 MED ORDER — LACTATED RINGERS IV SOLN
500.0000 mL | INTRAVENOUS | Status: DC | PRN
  Administered 2014-01-12: 1000 mL via INTRAVENOUS

## 2014-01-12 MED ORDER — SIMETHICONE 80 MG PO CHEW
80.0000 mg | CHEWABLE_TABLET | ORAL | Status: DC | PRN
  Administered 2014-01-13 (×4): 80 mg via ORAL
  Filled 2014-01-12 (×5): qty 1

## 2014-01-12 MED ORDER — PHENYLEPHRINE 40 MCG/ML (10ML) SYRINGE FOR IV PUSH (FOR BLOOD PRESSURE SUPPORT)
80.0000 ug | PREFILLED_SYRINGE | INTRAVENOUS | Status: DC | PRN
  Filled 2014-01-12: qty 2

## 2014-01-12 MED ORDER — FENTANYL 2.5 MCG/ML BUPIVACAINE 1/10 % EPIDURAL INFUSION (WH - ANES)
14.0000 mL/h | INTRAMUSCULAR | Status: DC | PRN
  Administered 2014-01-12: 14 mL/h via EPIDURAL
  Filled 2014-01-12 (×2): qty 125

## 2014-01-12 MED ORDER — TERBUTALINE SULFATE 1 MG/ML IJ SOLN: 0.2500 mg | Freq: Once | INTRAMUSCULAR | Status: DC | PRN

## 2014-01-12 MED ORDER — LACTATED RINGERS IV SOLN
INTRAVENOUS | Status: DC
  Administered 2014-01-12 (×3): via INTRAVENOUS

## 2014-01-12 NOTE — Anesthesia Procedure Notes (Signed)
Epidural Patient location during procedure: OB Start time: 01/12/2014 8:23 AM End time: 01/12/2014 8:38 AM  Staffing Anesthesiologist: Lewie LoronGERMEROTH, Sydney Azure R Performed by: anesthesiologist   Preanesthetic Checklist Completed: patient identified, pre-op evaluation, timeout performed, IV checked, risks and benefits discussed and monitors and equipment checked  Epidural Patient position: sitting Prep: site prepped and draped and DuraPrep Patient monitoring: blood pressure, continuous pulse ox and heart rate Approach: midline Injection technique: LOR saline and LOR air  Needle:  Needle type: Tuohy  Needle gauge: 17 G Needle length: 9 cm Needle insertion depth: 7 cm Catheter type: closed end flexible Catheter size: 19 Gauge Catheter at skin depth: 13 cm Test dose: negative  Assessment Sensory level: T7 Events: blood not aspirated, injection not painful, no injection resistance, negative IV test and no paresthesia  Additional Notes Reason for block:procedure for pain

## 2014-01-12 NOTE — Progress Notes (Signed)
Pts mother seems agitated with staff. Pt called out asking for soda, NT assisted the pt with menu to order something besides ginger ale. Pt is very upset that she does not have option of coke or sprite. NT explained she needed to order that from cafeteria. Maternal mother came out to desk upset with nursery RN for not allowing the baby to have an entire bottle of formula. Nursery RN in room explaining and showing them the policy. Pt was ordering from cafeteria as RN was leaving the room. Pt seemed fine with those options. Family seemed a little more agitated then the pt.

## 2014-01-12 NOTE — Progress Notes (Signed)
  Subjective: Feeling some pressure, some awareness of contractions.  Objective: BP 136/88  Pulse 92  Temp(Src) 98.2 F (36.8 C) (Oral)  Resp 18  Ht 5\' 5"  (1.651 m)  Wt 264 lb (119.75 kg)  BMI 43.93 kg/m2  SpO2 100%  LMP 04/04/2013      FHT: Category 2, no decels, negative spontaneous CST--pitocin off since approx 10:39a. UC:   regular, every 2-3 minutes SVE:   Dilation: 6 Effacement (%): 80 Station: -2 Exam by:: Chip BoerVicki MVUs 120  Assessment / Plan: Inadequate labor Will restart pitocin and increase slowly to facilitate adequacy. Close observation of FHR status. Dr. Stefano GaulStringer updated  Kristen Warner, Kristen Warner 01/12/2014, 1:08 PM

## 2014-01-12 NOTE — Progress Notes (Signed)
  Subjective: Comfortable with epidural--had hypotensive episode, now resolved.  Responded to fluid bolus and position change.  Objective: BP 112/67  Pulse 80  Temp(Src) 98.4 F (36.9 C) (Oral)  Resp 20  Ht 5\' 5"  (1.651 m)  Wt 264 lb (119.75 kg)  BMI 43.93 kg/m2  SpO2 100%  LMP 04/04/2013      FHT:  Category 1 UC:   irregular, every 3-5 minutes SVE:   Dilation: 5.5 Effacement (%): 80 Station: -2 Exam by:: Wahid Holley IUPC and FSE applied. Foley inserted. Pitocin on 6 mu/min  Assessment / Plan: Progressive labor TOC Continue to monitor FHR and UC status closely.  Vance Hochmuth 01/12/2014, 10:18 AM

## 2014-01-12 NOTE — H&P (Signed)
Kristen Warner is a 28 y.o. female presenting for PROM at 40w 3d. History  Pt intially presented to MAU with c/o of headache throughout today as well as "spots in vision" when she changed position earlier this evening.  She then went to [facility] to check her BP and she states it was 142/83.  She has experienced increased edema of her lower extrems as well as face and hands.  She also reports increased watery vag discharge since 01/10/14 but a definite increase since arising at 11:30AM on 01/11/14.  She denies bldg.  Reports normal fetal movement.    Hx Present Pregnancy: Pt entered care at 9w 6d.  Neg 1st trim screen.  Neg MSAFP.  Ultrasound at 18wks with no anatomic abnormalities identified.  Pt with persistent nausea and vomiting relieved with Diclegis.  1hr GTT wnl.  Tx for yeast at 36wks.  C/O increased lower extrem edema and lt lower leg pain at 38w 6d and doppler studies were neg.  Ultrasound due to size greater than dates obtained at that time as well and EFW 9#.  PIH labs as well as urine protein/creatinine ratio obtained on 1/21 and 1/27 due to swelling and all results WNL.   OB History   Grav Para Term Preterm Abortions TAB SAB Ect Mult Living   4 3 1 2      3      Past Medical History  Diagnosis Date  . Bronchitis   . Preterm labor   . Pregnancy induced hypertension     induced for PIH with first  . Infection     UTI   Past Surgical History  Procedure Laterality Date  . Cesarean section     Family History: family history includes Diabetes in her sister; Heart disease in her father, paternal grandfather, and paternal grandmother; Hypertension in her mother. Social History:  reports that she has quit smoking. She has never used smokeless tobacco. She reports that she does not drink alcohol or use illicit drugs.   Prenatal Transfer Tool  Maternal Diabetes: No Genetic Screening: Normal Maternal Ultrasounds/Referrals: Normal Fetal Ultrasounds or other Referrals:  None Maternal  Substance Abuse:  No Significant Maternal Medications:  None Significant Maternal Lab Results:  None Other Comments:  None  Review of Systems  Constitutional: Negative.   HENT: Negative.   Eyes: Negative.   Respiratory: Negative.   Cardiovascular: Negative.   Gastrointestinal: Negative.   Genitourinary: Negative.   Musculoskeletal: Negative.   Skin: Negative.   Neurological: Negative.   Endo/Heme/Allergies: Negative.   Psychiatric/Behavioral: Negative.     Dilation: 3 Effacement (%): 70 Exam by:: Elsie Ra, CNM Blood pressure 137/82, pulse 89, temperature 98.5 F (36.9 C), temperature source Oral, resp. rate 20, height 5\' 5"  (1.651 m), weight 119.75 kg (264 lb), last menstrual period 04/04/2013.  01/12/14 0110 01/12/14 0148 01/12/14 0203  BP: 133/63 135/74 142/79  Pulse: 105 91 107  Temp: 97.9 F (36.6 C)    TempSrc: Oral    Resp: 18    Height:     Weight:      Maternal Exam:  Uterine Assessment: Contraction strength is mild.  Contraction frequency is regular.  Contractions every 2-3 mins on toco  Abdomen: Patient reports no abdominal tenderness. Surgical scars: low transverse.   Fundal height is 40.   Estimated fetal weight is 8#.   Fetal presentation: vertex  Introitus: Normal vulva. Ferning test: positive.  Nitrazine test: not done. Amniotic fluid character: clear.  Pelvis: adequate for delivery.  Cervix: Cervix evaluated by digital exam.     Fetal Exam Fetal Monitor Review: Mode: ultrasound.   Baseline rate: 145.  Variability: moderate (6-25 bpm).   Pattern: accelerations present and no decelerations.    Fetal State Assessment: Category I - tracings are normal.     Physical Exam  Nursing note and vitals reviewed. Constitutional: She is oriented to person, place, and time. She appears well-developed and well-nourished.  HENT:  Head: Normocephalic and atraumatic.  Right Ear: External ear normal.  Left Ear: External ear normal.  Nose: Nose  normal.  Eyes: Conjunctivae are normal. Pupils are equal, round, and reactive to light.  Neck: Normal range of motion. Neck supple.  Cardiovascular: Normal rate, regular rhythm and intact distal pulses.   Respiratory: Effort normal and breath sounds normal.  GI: Soft. Bowel sounds are normal.  Genitourinary:  Perineum with clear discharge noted as well as at the vaginal introitus.  Musculoskeletal: Normal range of motion.  Neurological: She is alert and oriented to person, place, and time. She has normal reflexes.  Skin: Skin is warm and dry.  Psychiatric: She has a normal mood and affect. Her behavior is normal.  Fern:  Positive  Prenatal labs: ABO, Rh: O/Positive/-- (06/25 0000) Antibody: Negative (06/25 0000) Rubella: Immune (06/25 0000) RPR: Nonreactive (06/25 0000)  HBsAg: Negative (06/25 0000)  HIV: Non-reactive (06/25 0000)  GBS: Positive (07/15 0000)   Assessment/Plan: IUP at 40w 3d PROM Previous C/S x 2 - requests TOLAC  Positive GBS  [facility] Dr. Stefano GaulStringer. Will begin PCN G for positive GBS Per Dr. Stefano GaulStringer, will begin pitocin for induction due to prolonged rupture after RBA d/w of TOLAC d/w pt again.  Pt states she desires to proceed with TOLAC.   Pt plans epidural placement during labor.   Kristen Warner O. 01/12/2014, 4:28 AM

## 2014-01-12 NOTE — Progress Notes (Signed)
Series of late decels after patient sat up in bed. Cervix same, vtx well-applied at -2, 5-6 cm, 90%. Pitocin off, position changed, IV bolus initiated. Amnioinfusion begun.  FHR Category 2 at present, moderate variability at present UCs q 4 min after pitocin stopped.  Will CTO at present. Reviewed issue with patient--advised her that if repetitive FHR decels were to persist despite interventions, C/S would be required.  She seems to understand and is agreeable with C/S if deemed necessary. Dr. Stefano GaulStringer updated.  Kristen Warner 01/12/2014, 10:40 AM

## 2014-01-12 NOTE — MAU Note (Signed)
Pt reports a lot of swelling, headache, seeing spots.  Denies VB.

## 2014-01-12 NOTE — Progress Notes (Signed)
  Subjective: Sleeping at intervals--comfortable.  Objective: BP 128/55  Pulse 86  Temp(Src) 98 F (36.7 C) (Oral)  Resp 18  Ht 5\' 5"  (1.651 m)  Wt 264 lb (119.75 kg)  BMI 43.93 kg/m2  SpO2 100%  LMP 04/04/2013      FHT: Category 2--occasional variables, intermittent moderate variability, some diminished variability, negative CST UC:  UCs q 2-3 min, MVUs 145 SVE:   Deferred  Pitocin on 5 mu/min  Assessment / Plan: Inadequate UCs Will continue to augment to establish adequacy.  [name], Jaque Dacy 01/12/2014, 3:43 PM

## 2014-01-12 NOTE — Anesthesia Preprocedure Evaluation (Signed)
Anesthesia Evaluation  Patient identified by MRN, date of birth, ID band Patient awake    Reviewed: Allergy & Precautions, H&P , NPO status , Patient's Chart, lab work & pertinent test results  Airway Mallampati: II TM Distance: >3 FB Neck ROM: Full    Dental  (+) Dental Advisory Given   Pulmonary former smoker,  breath sounds clear to auscultation        Cardiovascular hypertension, Pt. on medications Rhythm:Regular     Neuro/Psych negative neurological ROS  negative psych ROS   GI/Hepatic negative GI ROS, Neg liver ROS,   Endo/Other  negative endocrine ROS  Renal/GU negative Renal ROS     Musculoskeletal negative musculoskeletal ROS (+)   Abdominal   Peds  Hematology negative hematology ROS (+)   Anesthesia Other Findings   Reproductive/Obstetrics (+) Pregnancy                           Anesthesia Physical Anesthesia Plan  ASA: III  Anesthesia Plan: Epidural   Post-op Pain Management:    Induction:   Airway Management Planned:   Additional Equipment:   Intra-op Plan:   Post-operative Plan:   Informed Consent: I have reviewed the patients History and Physical, chart, labs and discussed the procedure including the risks, benefits and alternatives for the proposed anesthesia with the patient or authorized representative who has indicated his/her understanding and acceptance.     Plan Discussed with:   Anesthesia Plan Comments:         Anesthesia Quick Evaluation

## 2014-01-12 NOTE — Progress Notes (Signed)
  Subjective: More aware of contractions now--planning epidural.  Objective: BP 133/68  Pulse 75  Temp(Src) 98.4 F (36.9 C) (Oral)  Resp 20  Ht 5\' 5"  (1.651 m)  Wt 264 lb (119.75 kg)  BMI 43.93 kg/m2  LMP 04/04/2013      FHT:  Category 1 UC:   irregular, every 2-4 minutes SVE:   Dilation: 3.5 Effacement (%): 80 Station: -2 Exam by:: Chip BoerVicki Pitocin on 6 mu/min  Assessment / Plan: Progressive labor Previous C/S x 2, prior vaginal delivery Possible LGA infant GBS positive  Plan: Will place epidural at patient's request. Plan placement of internal monitors after epidural placed. R&B of TOL reviewed again with patient, specifically risk of uterine rupture, and failure of trial--she wishes to continue to TOL. Close observation of FHR and UC status.  Kristen BridgemanLATHAM, Kristen Warner 01/12/2014, 7:58 AM

## 2014-01-13 ENCOUNTER — Encounter (HOSPITAL_COMMUNITY): Payer: Self-pay | Admitting: *Deleted

## 2014-01-13 LAB — CBC
HEMATOCRIT: 29.3 % — AB (ref 36.0–46.0)
HEMOGLOBIN: 9.9 g/dL — AB (ref 12.0–15.0)
MCH: 30.2 pg (ref 26.0–34.0)
MCHC: 33.8 g/dL (ref 30.0–36.0)
MCV: 89.3 fL (ref 78.0–100.0)
Platelets: 190 10*3/uL (ref 150–400)
RBC: 3.28 MIL/uL — ABNORMAL LOW (ref 3.87–5.11)
RDW: 13.5 % (ref 11.5–15.5)
WBC: 13.2 10*3/uL — ABNORMAL HIGH (ref 4.0–10.5)

## 2014-01-13 NOTE — Progress Notes (Signed)
Post Partum Day 1: S/P VBAC with a left vaginal with extension to labia  Subjective: Patient up ad lib, denies syncope or dizziness. Feeding:  Breastfeeding Contraceptive plan:   Depo vs OCPs/POPs  Objective: Blood pressure 122/77, pulse 82, temperature 98.1 F (36.7 C), temperature source Oral, resp. rate 18, height 5\' 5"  (1.651 m), weight 264 lb (119.75 kg), last menstrual period 04/04/2013, SpO2 100.00%, unknown if currently breastfeeding.  Physical Exam:  General: alert, cooperative and no distress Lochia: appropriate Uterine Fundus: firm Incision: healing well DVT Evaluation: No evidence of DVT seen on physical exam. Negative Homan's sign.   Recent Labs  01/12/14 0257 01/13/14 0607  HGB 11.5* 9.9*  HCT 32.3* 29.3*    Assessment/Plan: S/P Vaginal delivery day 1 Asymptomatic Anemia Continue current care Plan for discharge tomorrow   LOS: 1 day   Mckinsley Koelzer 01/13/2014, 7:05 AM

## 2014-01-13 NOTE — Progress Notes (Signed)
Post discharge review completed. 

## 2014-01-13 NOTE — Anesthesia Postprocedure Evaluation (Addendum)
  Anesthesia Post-op Note  Patient: Kristen Warner  Procedure(s) Performed: * No procedures listed *  Patient Location: Mother/Baby  Anesthesia Type:Epidural  Level of Consciousness: awake, alert , oriented and patient cooperative  Airway and Oxygen Therapy: Patient Spontanous Breathing  Post-op Pain: mild  Post-op Assessment: Patient's Cardiovascular Status Stable and Respiratory Function Stable No residual numbness or motor impairment  Post-op Vital Signs: stable  Complications: No apparent anesthesia complications

## 2014-01-14 MED ORDER — MEDROXYPROGESTERONE ACETATE 150 MG/ML IM SUSP
150.0000 mg | Freq: Once | INTRAMUSCULAR | Status: AC
  Administered 2014-01-14: 150 mg via INTRAMUSCULAR
  Filled 2014-01-14: qty 1

## 2014-01-14 MED ORDER — OXYCODONE-ACETAMINOPHEN 5-325 MG PO TABS: 1.0000 | ORAL_TABLET | ORAL | Status: DC | PRN

## 2014-01-14 MED ORDER — IBUPROFEN 600 MG PO TABS: 600.0000 mg | ORAL_TABLET | Freq: Four times a day (QID) | ORAL | Status: DC

## 2014-01-14 NOTE — Discharge Summary (Signed)
Vaginal Delivery Discharge Summary  Kristen Warner  DOB:    [birth year] MRN:    [id number] CSN:    [id number]  Date of admission:                  01/12/14   Date of discharge:                   01/14/14  Procedures this admission: labor epidural, VBAC   Date of Delivery: 01/12/14   Newborn Data:  Live born female  Birth Weight: 8 lb 15.9 oz (4080 g) APGAR: 8, 9  Home with mother.    History of Present Illness:  [name] is a 28 y.o. female, [phone], who presents at [redacted]w[redacted]d weeks gestation. The patient has been followed at the [facility] division of [facility]. She was admitted onset of labor and rupture of membranes. Her pregnancy has been complicated by: hx c/s x2   Hospital course:  The patient was admitted for early labor and PROM .   Her labor was not complicated. She proceeded to have a vaginal delivery of a healthy infant. Her delivery was not complicated. Her postpartum course was not complicated.  She was discharged to home on postpartum day 2 doing well.  Feeding:  bottle  Contraception:  Depo-Provera  Discharge hemoglobin:  Hemoglobin  Date Value Range Status  01/13/2014 9.9* 12.0 - 15.0 g/dL Final     HCT  Date Value Range Status  01/13/2014 29.3* 36.0 - 46.0 % Final    Discharge Physical Exam:   General: alert and no distress Lochia: appropriate Uterine Fundus: firm Incision: healing well DVT Evaluation: No evidence of DVT seen on physical exam. Negative Homan's sign. No significant calf/ankle edema.  Intrapartum Procedures: VBAC Postpartum Procedures: none Complications-Operative and Postpartum: none  Discharge Diagnoses: Term Pregnancy-delivered  Discharge Information:  Activity:           pelvic rest Diet:                routine and iron-rich Medications: PNV, Ibuprofen and Percocet Condition:      stable Instructions:   Postpartum Care After Vaginal Delivery  After you deliver  your newborn (postpartum period), the usual stay in the hospital is 24 72 hours. If there were problems with your labor or delivery, or if you have other medical problems, you might be in the hospital longer.  While you are in the hospital, you will receive help and instructions on how to care for yourself and your newborn during the postpartum period.  While you are in the hospital:  Be sure to tell your nurses if you have pain or discomfort, as well as where you feel the pain and what makes the pain worse.  If you had an incision made near your vagina (episiotomy) or if you had some tearing during delivery, the nurses may put ice packs on your episiotomy or tear. The ice packs may help to reduce the pain and swelling.  If you are breastfeeding, you may feel uncomfortable contractions of your uterus for a couple of weeks. This is normal. The contractions help your uterus get back to normal size.  It is normal to have some bleeding after delivery.  For the first 1 3 days after delivery, the flow is red and the amount may be similar to a period.  It is common for the flow to start and stop.  In the first few  days, you may pass some small clots. Let your nurses know if you begin to pass large clots or your flow increases.  Do not  flush blood clots down the toilet before having the nurse look at them.  During the next 3 10 days after delivery, your flow should become more watery and pink or brown-tinged in color.  Ten to fourteen days after delivery, your flow should be a small amount of yellowish-white discharge.  The amount of your flow will decrease over the first few weeks after delivery. Your flow may stop in 6 8 weeks. Most women have had their flow stop by 12 weeks after delivery.  You should change your sanitary pads frequently.  Wash your hands thoroughly with soap and water for at least 20 seconds after changing pads, using the toilet, or before holding or feeding your  newborn.  You should feel like you need to empty your bladder within the first 6 8 hours after delivery.  In case you become weak, lightheaded, or faint, call your nurse before you get out of bed for the first time and before you take a shower for the first time.  Within the first few days after delivery, your breasts may begin to feel tender and full. This is called engorgement. Breast tenderness usually goes away within 48 72 hours after engorgement occurs. You may also notice milk leaking from your breasts. If you are not breastfeeding, do not stimulate your breasts. Breast stimulation can make your breasts produce more milk.  Spending as much time as possible with your newborn is very important. During this time, you and your newborn can feel close and get to know each other. Having your newborn stay in your room (rooming in) will help to strengthen the bond with your newborn. It will give you time to get to know your newborn and become comfortable caring for your newborn.  Your hormones change after delivery. Sometimes the hormone changes can temporarily cause you to feel sad or tearful. These feelings should not last more than a few days. If these feelings last longer than that, you should talk to your caregiver.  If desired, talk to your caregiver about methods of family planning or contraception.  Talk to your caregiver about immunizations. Your caregiver may want you to have the following immunizations before leaving the hospital:  Tetanus, diphtheria, and pertussis (Tdap) or tetanus and diphtheria (Td) immunization. It is very important that you and your family (including grandparents) or others caring for your newborn are up-to-date with the Tdap or Td immunizations. The Tdap or Td immunization can help protect your newborn from getting ill.  Rubella immunization.  Varicella (chickenpox) immunization.  Influenza immunization. You should receive this annual immunization if you did  not receive the immunization during your pregnancy. Document Released: 09/25/2007 Document Revised: 08/22/2012 Document Reviewed: 07/25/2012 [facility] Patient Information 2014 [place], [place].   Postpartum Depression and Baby Blues  The postpartum period begins right after the birth of a baby. During this time, there is often a great amount of joy and excitement. It is also a time of considerable changes in the life of the parent(s). Regardless of how many times a mother gives birth, each child brings new challenges and dynamics to the family. It is not unusual to have feelings of excitement accompanied by confusing shifts in moods, emotions, and thoughts. All mothers are at risk of developing postpartum depression or the "baby blues." These mood changes can occur right after giving birth, or  they may occur many months after giving birth. The baby blues or postpartum depression can be mild or severe. Additionally, postpartum depression can resolve rather quickly, or it can be a long-term condition. CAUSES Elevated hormones and their rapid decline are thought to be a main cause of postpartum depression and the baby blues. There are a number of hormones that radically change during and after pregnancy. Estrogen and progesterone usually decrease immediately after delivering your baby. The level of thyroid hormone and various cortisol steroids also rapidly drop. Other factors that play a major role in these changes include major life events and genetics.  RISK FACTORS If you have any of the following risks for the baby blues or postpartum depression, know what symptoms to watch out for during the postpartum period. Risk factors that may increase the likelihood of getting the baby blues or postpartum depression include:  Havinga personal or family history of depression.  Having depression while being pregnant.  Having premenstrual or oral contraceptive-associated mood issues.  Having exceptional life  stress.  Having marital conflict.  Lacking a social support network.  Having a baby with special needs.  Having health problems such as diabetes. SYMPTOMS Baby blues symptoms include:  Brief fluctuations in mood, such as going from extreme happiness to sadness.  Decreased concentration.  Difficulty sleeping.  Crying spells, tearfulness.  Irritability.  Anxiety. Postpartum depression symptoms typically begin within the first month after giving birth. These symptoms include:  Difficulty sleeping or excessive sleepiness.  Marked weight loss.  Agitation.  Feelings of worthlessness.  Lack of interest in activity or food. Postpartum psychosis is a very concerning condition and can be dangerous. Fortunately, it is rare. Displaying any of the following symptoms is cause for immediate medical attention. Postpartum psychosis symptoms include:  Hallucinations and delusions.  Bizarre or disorganized behavior.  Confusion or disorientation. DIAGNOSIS  A diagnosis is made by an evaluation of your symptoms. There are no medical or lab tests that lead to a diagnosis, but there are various questionnaires that a caregiver may use to identify those with the baby blues, postpartum depression, or psychosis. Often times, a screening tool called the [place] Postnatal Depression Scale is used to diagnose depression in the postpartum period.  TREATMENT The baby blues usually goes away on its own in 1 to 2 weeks. Social support is often all that is needed. You should be encouraged to get adequate sleep and rest. Occasionally, you may be given medicines to help you sleep.  Postpartum depression requires treatment as it can last several months or longer if it is not treated. Treatment may include individual or group therapy, medicine, or both to address any social, physiological, and psychological factors that may play a role in the depression. Regular exercise, a healthy diet, rest, and social  support may also be strongly recommended.  Postpartum psychosis is more serious and needs treatment right away. Hospitalization is often needed. HOME CARE INSTRUCTIONS  Get as much rest as you can. Nap when the baby sleeps.  Exercise regularly. Some women find yoga and walking to be beneficial.  Eat a balanced and nourishing diet.  Do little things that you enjoy. Have a cup of tea, take a bubble bath, read your favorite magazine, or listen to your favorite music.  Avoid alcohol.  Ask for help with household chores, cooking, grocery shopping, or running errands as needed. Do not try to do everything.  Talk to people close to you about how you are feeling. Get support from  your partner, family members, friends, or other new moms.  Try to stay positive in how you think. Think about the things you are grateful for.  Do not spend a lot of time alone.  Only take medicine as directed by your caregiver.  Keep all your postpartum appointments.  Let your caregiver know if you have any concerns. SEEK MEDICAL CARE IF: You are having a reaction or problems with your medicine. SEEK IMMEDIATE MEDICAL CARE IF:  You have suicidal feelings.  You feel you may harm the baby or someone else. Document Released: 09/01/2004 Document Revised: 02/20/2012 Document Reviewed: 10/04/2011 [facility] Patient Information 2014 [employer].   Discharge to: home  Follow-up Information   [facility] weeks.   Specialty:  Obstetrics and Gynecology   Contact information:   [address] [place] [id number] [phone]       Malissa HippoLILLARD,Emalea Mix M 01/14/2014

## 2014-01-14 NOTE — Progress Notes (Signed)
Kristen Warner is a 28 y.o. [phone] at 775w3d admitted for PROM  Subjective: Pt now on birthing suites.  Denies any pain or contractions at present.    Objective: BP 128/75  Pulse 84  Temp(Src) 98.5 F (36.9 C) (Oral)  Resp 20  Ht 5\' 5"  (1.651 m)  Wt 119.75 kg (264 lb)  BMI 43.93 kg/m2  SpO2 98%  LMP 04/04/2013  FHT:  FHR: 135 bpm, variability: moderate,  accelerations:  Present,  decelerations:  Absent UC:   regular, every 2-3 minutes, mild to palpation.  Pt talking through UCs. SVE:   Dilation: 3 Effacement (%): 80 Station: -3 Exam by:: Conni ElliotN. Swan Fairfax, CNM   Assessment / Plan: IUP at 40w 3d PROM Prev C/S x 2 - desires TOLAC  Labor: No labor at present Preeclampsia:  no signs or symptoms of toxicity Fetal Wellbeing:  Category I Pain Control:  Planning epidural I/D:  Afebrile/Pos GBS/PCN G started Anticipated MOD:  NSVD   RBA pitocin augmentation in light of TOLAC/prev c/s x 2 d/w pt again, including risk of uterine rupture.  D/W pt due to prolonged ROM, pitocin augmentation advisable at the present time.  Pt desires to proceed. Pitocin per protocol at the present time per Dr. Stefano GaulStringer.  Briani Maul O. 01/14/2014, 9:57 PM

## 2014-01-14 NOTE — Discharge Instructions (Signed)
Vaginal Delivery °Care After °Refer to this sheet in the next few weeks. These discharge instructions provide you with information on caring for yourself after delivery. Your caregiver may also give you specific instructions. Your treatment has been planned according to the most current medical practices available, but problems sometimes occur. Call your caregiver if you have any problems or questions after you go home. °HOME CARE INSTRUCTIONS °· Take over-the-counter or prescription medicines only as directed by your caregiver or pharmacist. °· Do not drink alcohol, especially if you are breastfeeding or taking medicine to relieve pain. °· Do not chew or smoke tobacco. °· Do not use illegal drugs. °· Continue to use good perineal care. Good perineal care includes: °· Wiping your perineum from front to back. °· Keeping your perineum clean. °· Do not use tampons or douche until your caregiver says it is okay. °· Shower, wash your hair, and take tub baths as directed by your caregiver. °· Wear a well-fitting bra that provides breast support. °· Eat healthy foods. °· Drink enough fluids to keep your urine clear or pale yellow. °· Eat high-fiber foods such as whole grain cereals and breads, brown rice, beans, and fresh fruits and vegetables every day. These foods may help prevent or relieve constipation. °· Follow your cargiver's recommendations regarding resumption of activities such as climbing stairs, driving, lifting, exercising, or traveling. °· Talk to your caregiver about resuming sexual activities. Resumption of sexual activities is dependent upon your risk of infection, your rate of healing, and your comfort and desire to resume sexual activity. °· Try to have someone help you with your household activities and your newborn for at least a few days after you leave the hospital. °· Rest as much as possible. Try to rest or take a nap when your newborn is sleeping. °· Increase your activities gradually. °· Keep all  of your scheduled postpartum appointments. It is very important to keep your scheduled follow-up appointments. At these appointments, your caregiver will be checking to make sure that you are healing physically and emotionally. °SEEK MEDICAL CARE IF:  °· You are passing large clots from your vagina. Save any clots to show your caregiver. °· You have a foul smelling discharge from your vagina. °· You have trouble urinating. °· You are urinating frequently. °· You have pain when you urinate. °· You have a change in your bowel movements. °· You have increasing redness, pain, or swelling near your vaginal incision (episiotomy) or vaginal tear. °· You have pus draining from your episiotomy or vaginal tear. °· Your episiotomy or vaginal tear is separating. °· You have painful, hard, or reddened breasts. °· You have a severe headache. °· You have blurred vision or see spots. °· You feel sad or depressed. °· You have thoughts of hurting yourself or your newborn. °· You have questions about your care, the care of your newborn, or medicines. °· You are dizzy or lightheaded. °· You have a rash. °· You have nausea or vomiting. °· You were breastfeeding and have not had a menstrual period within 12 weeks after you stopped breastfeeding. °· You are not breastfeeding and have not had a menstrual period by the 12th week after delivery. °· You have a fever. °SEEK IMMEDIATE MEDICAL CARE IF:  °· You have persistent pain. °· You have chest pain. °· You have shortness of breath. °· You faint. °· You have leg pain. °· You have stomach pain. °· Your vaginal bleeding saturates two or more sanitary pads   in 1 hour. °MAKE SURE YOU:  °· Understand these instructions. °· Will watch your condition. °· Will get help right away if you are not doing well or get worse. °Document Released: 11/25/2000 Document Revised: 08/22/2012 Document Reviewed: 07/25/2012 °ExitCare® Patient Information ©2014 [employer]. ° °Postpartum Depression and Baby  Blues °The postpartum period begins right after the birth of a baby. During this time, there is often a great amount of joy and excitement. It is also a time of considerable changes in the life of the parent(s). Regardless of how many times a mother gives birth, each child brings new challenges and dynamics to the family. It is not unusual to have feelings of excitement accompanied by confusing shifts in moods, emotions, and thoughts. All mothers are at risk of developing postpartum depression or the "baby blues." These mood changes can occur right after giving birth, or they may occur many months after giving birth. The baby blues or postpartum depression can be mild or severe. Additionally, postpartum depression can resolve rather quickly, or it can be a long-term condition. °CAUSES °Elevated hormones and their rapid decline are thought to be a main cause of postpartum depression and the baby blues. There are a number of hormones that radically change during and after pregnancy. Estrogen and progesterone usually decrease immediately after delivering your baby. The level of thyroid hormone and various cortisol steroids also rapidly drop. Other factors that play a major role in these changes include major life events and genetics.  °RISK FACTORS °If you have any of the following risks for the baby blues or postpartum depression, know what symptoms to watch out for during the postpartum period. Risk factors that may increase the likelihood of getting the baby blues or postpartum depression include: °· Having a personal or family history of depression. °· Having depression while being pregnant. °· Having premenstrual or oral contraceptive-associated mood issues. °· Having exceptional life stress. °· Having marital conflict. °· Lacking a social support network. °· Having a baby with special needs. °· Having health problems such as diabetes. °SYMPTOMS °Baby blues symptoms include: °· Brief fluctuations in mood, such as  going from extreme happiness to sadness. °· Decreased concentration. °· Difficulty sleeping. °· Crying spells, tearfulness. °· Irritability. °· Anxiety. °Postpartum depression symptoms typically begin within the first month after giving birth. These symptoms include: °· Difficulty sleeping or excessive sleepiness. °· Marked weight loss. °· Agitation. °· Feelings of worthlessness. °· Lack of interest in activity or food. °Postpartum psychosis is a very concerning condition and can be dangerous. Fortunately, it is rare. Displaying any of the following symptoms is cause for immediate medical attention. Postpartum psychosis symptoms include: °· Hallucinations and delusions. °· Bizarre or disorganized behavior. °· Confusion or disorientation. °DIAGNOSIS  °A diagnosis is made by an evaluation of your symptoms. There are no medical or lab tests that lead to a diagnosis, but there are various questionnaires that a caregiver may use to identify those with the baby blues, postpartum depression, or psychosis. Often times, a screening tool called the Edinburgh Postnatal Depression Scale is used to diagnose depression in the postpartum period.  °TREATMENT °The baby blues usually goes away on its own in 1 to 2 weeks. Social support is often all that is needed. You should be encouraged to get adequate sleep and rest. Occasionally, you may be given medicines to help you sleep.  °Postpartum depression requires treatment as it can last several months or longer if it is not treated. Treatment may include individual or   group therapy, medicine, or both to address any social, physiological, and psychological factors that may play a role in the depression. Regular exercise, a healthy diet, rest, and social support may also be strongly recommended.  Postpartum psychosis is more serious and needs treatment right away. Hospitalization is often needed. HOME CARE INSTRUCTIONS  Get as much rest as you can. Nap when the baby  sleeps.  Exercise regularly. Some women find yoga and walking to be beneficial.  Eat a balanced and nourishing diet.  Do little things that you enjoy. Have a cup of tea, take a bubble bath, read your favorite magazine, or listen to your favorite music.  Avoid alcohol.  Ask for help with household chores, cooking, grocery shopping, or running errands as needed. Do not try to do everything.  Talk to people close to you about how you are feeling. Get support from your partner, family members, friends, or other new moms.  Try to stay positive in how you think. Think about the things you are grateful for.  Do not spend a lot of time alone.  Only take medicine as directed by your caregiver.  Keep all your postpartum appointments.  Let your caregiver know if you have any concerns. SEEK MEDICAL CARE IF: You are having a reaction or problems with your medicine. SEEK IMMEDIATE MEDICAL CARE IF:  You have suicidal feelings.  You feel you may harm the baby or someone else. Document Released: 09/01/2004 Document Revised: 02/20/2012 Document Reviewed: 09/09/2013 [facility] Patient Information 2014 [place]Care, [place].  Iron-Rich Diet An iron-rich diet contains foods that are good sources of iron. Iron is an important mineral that helps your body produce hemoglobin. Hemoglobin is a protein in red blood cells that carries oxygen to the body's tissues. Sometimes, the iron level in your blood can be low. This may be caused by:  A lack of iron in your diet.  Blood loss.  Times of growth, such as during pregnancy or during a child's growth and development. Low levels of iron can cause a decrease in the number of red blood cells. This can result in iron deficiency anemia. Iron deficiency anemia symptoms include:  Tiredness.  Weakness.  Irritability.  Increased chance of infection. Here are some recommendations for daily iron intake:  Males older than 26 years of age need 8 mg of iron per  day.  Women ages 8219 to 1650 need 18 mg of iron per day.  Pregnant women need 27 mg of iron per day, and women who are over 319 years of age and breastfeeding need 9 mg of iron per day.  Women over the age of 26 need 8 mg of iron per day. SOURCES OF IRON There are 2 types of iron that are found in food: heme iron and nonheme iron. Heme iron is absorbed by the body better than nonheme iron. Heme iron is found in meat, poultry, and fish. Nonheme iron is found in grains, beans, and vegetables. Heme Iron Sources Food / Iron (mg)  Chicken liver, 3 oz (85 g)/ 10 mg  Beef liver, 3 oz (85 g)/ 5.5 mg  Oysters, 3 oz (85 g)/ 8 mg  Beef, 3 oz (85 g)/ 2 to 3 mg  Shrimp, 3 oz (85 g)/ 2.8 mg  [place], 3 oz (85 g)/ 2 mg  Chicken, 3 oz (85 g) / 1 mg  Fish (tuna, halibut), 3 oz (85 g)/ 1 mg  Pork, 3 oz (85 g)/ 0.9 mg Nonheme Iron Sources Food / Iron (mg)  Ready-to-eat breakfast cereal, iron-fortified / 3.9 to 7 mg °· Tofu, ½ cup / 3.4 mg °· Kidney beans, ½ cup / 2.6 mg °· Baked potato with skin / 2.7 mg °· Asparagus, ½ cup / 2.2 mg °· Avocado / 2 mg °· Dried peaches, ½ cup / 1.6 mg °· Raisins, ½ cup / 1.5 mg °· Soy milk, 1 cup / 1.5 mg °· Whole-wheat bread, 1 slice / 1.2 mg °· Spinach, 1 cup / 0.8 mg °· Broccoli, ½ cup / 0.6 mg °IRON ABSORPTION °Certain foods can decrease the body's absorption of iron. Try to avoid these foods and beverages while eating meals with iron-containing foods: °· Coffee. °· Tea. °· Fiber. °· Soy. °Foods containing vitamin C can help increase the amount of iron your body absorbs from iron sources, especially from nonheme sources. Eat foods with vitamin C along with iron-containing foods to increase your iron absorption. Foods that are high in vitamin C include many fruits and vegetables. Some good sources are: °· Fresh orange juice. °· Oranges. °· Strawberries. °· Mangoes. °· Grapefruit. °· Red bell peppers. °· Green bell peppers. °· Broccoli. °· Potatoes with skin. °· Tomato  juice. °Document Released: 07/12/2005 Document Revised: 02/20/2012 Document Reviewed: 05/19/2011 °ExitCare® Patient Information ©2014 [employer]. ° ° °

## 2014-02-25 ENCOUNTER — Emergency Department (HOSPITAL_COMMUNITY): Payer: Medicaid Other

## 2014-02-25 ENCOUNTER — Emergency Department (HOSPITAL_COMMUNITY)
Admission: EM | Admit: 2014-02-25 | Discharge: 2014-02-26 | Disposition: A | Discharge status: Home / Self Care | Payer: Medicaid Other | Attending: Emergency Medicine | Admitting: Emergency Medicine

## 2014-02-25 ENCOUNTER — Encounter (HOSPITAL_COMMUNITY): Payer: Self-pay | Admitting: Emergency Medicine

## 2014-02-25 DIAGNOSIS — Z3202 Encounter for pregnancy test, result negative: Secondary | ICD-10-CM | POA: Insufficient documentation

## 2014-02-25 DIAGNOSIS — R1084 Generalized abdominal pain: Secondary | ICD-10-CM | POA: Insufficient documentation

## 2014-02-25 DIAGNOSIS — Z87440 Personal history of urinary (tract) infections: Secondary | ICD-10-CM | POA: Insufficient documentation

## 2014-02-25 DIAGNOSIS — R141 Gas pain: Secondary | ICD-10-CM | POA: Insufficient documentation

## 2014-02-25 DIAGNOSIS — R112 Nausea with vomiting, unspecified: Secondary | ICD-10-CM | POA: Insufficient documentation

## 2014-02-25 DIAGNOSIS — R143 Flatulence: Secondary | ICD-10-CM

## 2014-02-25 DIAGNOSIS — Z8709 Personal history of other diseases of the respiratory system: Secondary | ICD-10-CM | POA: Insufficient documentation

## 2014-02-25 DIAGNOSIS — R142 Eructation: Secondary | ICD-10-CM | POA: Insufficient documentation

## 2014-02-25 DIAGNOSIS — R197 Diarrhea, unspecified: Secondary | ICD-10-CM | POA: Insufficient documentation

## 2014-02-25 DIAGNOSIS — Z87891 Personal history of nicotine dependence: Secondary | ICD-10-CM | POA: Insufficient documentation

## 2014-02-25 DIAGNOSIS — R109 Unspecified abdominal pain: Secondary | ICD-10-CM

## 2014-02-25 LAB — URINALYSIS, ROUTINE W REFLEX MICROSCOPIC
Bilirubin Urine: NEGATIVE
Glucose, UA: NEGATIVE mg/dL
Hgb urine dipstick: NEGATIVE
Ketones, ur: NEGATIVE mg/dL
LEUKOCYTES UA: NEGATIVE
Nitrite: NEGATIVE
PH: 6 (ref 5.0–8.0)
Protein, ur: NEGATIVE mg/dL
UROBILINOGEN UA: 0.2 mg/dL (ref 0.0–1.0)

## 2014-02-25 LAB — CBC WITH DIFFERENTIAL/PLATELET
BASOS PCT: 1 % (ref 0–1)
Basophils Absolute: 0 10*3/uL (ref 0.0–0.1)
EOS ABS: 0.1 10*3/uL (ref 0.0–0.7)
EOS PCT: 2 % (ref 0–5)
HCT: 40.3 % (ref 36.0–46.0)
Hemoglobin: 13.3 g/dL (ref 12.0–15.0)
LYMPHS ABS: 2 10*3/uL (ref 0.7–4.0)
Lymphocytes Relative: 48 % — ABNORMAL HIGH (ref 12–46)
MCH: 29.8 pg (ref 26.0–34.0)
MCHC: 33 g/dL (ref 30.0–36.0)
MCV: 90.4 fL (ref 78.0–100.0)
MONOS PCT: 9 % (ref 3–12)
Monocytes Absolute: 0.4 10*3/uL (ref 0.1–1.0)
Neutro Abs: 1.7 10*3/uL (ref 1.7–7.7)
Neutrophils Relative %: 40 % — ABNORMAL LOW (ref 43–77)
PLATELETS: 249 10*3/uL (ref 150–400)
RBC: 4.46 MIL/uL (ref 3.87–5.11)
RDW: 12.8 % (ref 11.5–15.5)
WBC: 4.3 10*3/uL (ref 4.0–10.5)

## 2014-02-25 LAB — COMPREHENSIVE METABOLIC PANEL
ALBUMIN: 4 g/dL (ref 3.5–5.2)
ALK PHOS: 94 U/L (ref 39–117)
ALT: 31 U/L (ref 0–35)
AST: 27 U/L (ref 0–37)
BUN: 10 mg/dL (ref 6–23)
CALCIUM: 9.3 mg/dL (ref 8.4–10.5)
CO2: 23 mEq/L (ref 19–32)
Chloride: 102 mEq/L (ref 96–112)
Creatinine, Ser: 0.94 mg/dL (ref 0.50–1.10)
GFR calc Af Amer: >90 mL/min (ref >90)
GFR calc non Af Amer: 84 mL/min — ABNORMAL LOW (ref >90)
Glucose, Bld: 90 mg/dL (ref 70–99)
POTASSIUM: 3.5 meq/L — AB (ref 3.7–5.3)
SODIUM: 140 meq/L (ref 137–147)
TOTAL PROTEIN: 7.8 g/dL (ref 6.0–8.3)
Total Bilirubin: 0.4 mg/dL (ref 0.3–1.2)

## 2014-02-25 LAB — LIPASE, BLOOD: Lipase: 19 U/L (ref 11–59)

## 2014-02-25 LAB — PREGNANCY, URINE: Preg Test, Ur: NEGATIVE

## 2014-02-25 MED ORDER — IOHEXOL 300 MG/ML  SOLN: 50.0000 mL | Freq: Once | INTRAMUSCULAR | Status: AC | PRN

## 2014-02-25 MED ORDER — FAMOTIDINE IN NACL 20-0.9 MG/50ML-% IV SOLN
20.0000 mg | Freq: Once | INTRAVENOUS | Status: AC
  Administered 2014-02-25: 20 mg via INTRAVENOUS
  Filled 2014-02-25: qty 50

## 2014-02-25 MED ORDER — DICYCLOMINE HCL 20 MG PO TABS: 20.0000 mg | ORAL_TABLET | Freq: Four times a day (QID) | ORAL | Status: DC | PRN

## 2014-02-25 MED ORDER — SODIUM CHLORIDE 0.9 % IV SOLN
INTRAVENOUS | Status: DC
  Administered 2014-02-25: 22:00:00 via INTRAVENOUS

## 2014-02-25 MED ORDER — DICYCLOMINE HCL 10 MG/ML IM SOLN
20.0000 mg | Freq: Once | INTRAMUSCULAR | Status: AC
  Administered 2014-02-25: 20 mg via INTRAMUSCULAR
  Filled 2014-02-25: qty 2

## 2014-02-25 MED ORDER — IOHEXOL 300 MG/ML  SOLN: 100.0000 mL | Freq: Once | INTRAMUSCULAR | Status: AC | PRN

## 2014-02-25 MED ORDER — SODIUM CHLORIDE 0.9 % IV BOLUS (SEPSIS)
500.0000 mL | Freq: Once | INTRAVENOUS | Status: AC
  Administered 2014-02-25: 500 mL via INTRAVENOUS

## 2014-02-25 NOTE — ED Notes (Signed)
Patient reports having mid abdominal pain, gas, vomiting and diarrhea x 6 weeks. Patient states the pain has worsened. Patient denies any blood in her vomit or stool.

## 2014-02-25 NOTE — ED Notes (Signed)
Pt unable to void at this time. 

## 2014-02-25 NOTE — ED Notes (Signed)
Pt st to writer that if staff gave her five minutes, she will try to urinate for staff.

## 2014-02-25 NOTE — ED Provider Notes (Signed)
CSN: [id number]     Arrival date & time 02/25/14  1712 History   First MD Initiated Contact with Patient 02/25/14 2041     Chief Complaint  Patient presents with  . Abdominal Pain  . Emesis  . Diarrhea     HPI Pt was seen at 2135.  Per pt, c/o gradual onset and persistence of multiple intermittent episodes of N/V/D that began several months ago, worse over the past 6 weeks.   Describes the stools as "watery."  Has been associated with generalized "cramping," "bloating," and "gassy" abd pain. States her "burps smell bad."  States she "hasn't mentioned it" to her PMD. Denies CP/SOB, no back pain, no fevers, no black or blood in stools or emesis, no recent travel.     Past Medical History  Diagnosis Date  . Bronchitis   . Preterm labor   . Pregnancy induced hypertension     induced for PIH with first  . Infection     UTI   Past Surgical History  Procedure Laterality Date  . Cesarean section     Family History  Problem Relation Age of Onset  . Hypertension Mother   . Heart disease Father   . Diabetes Sister   . Heart disease Paternal Grandfather   . Heart disease Paternal Grandmother    History  Substance Use Topics  . Smoking status: Former Games developermoker  . Smokeless tobacco: Never Used     Comment: quit 5 yrs ago  . Alcohol Use: No   OB History   Grav Para Term Preterm Abortions TAB SAB Ect Mult Living   4 4 2 2      4      Review of Systems ROS: Statement: All systems negative except as marked or noted in the HPI; Constitutional: Negative for fever and chills. ; ; Eyes: Negative for eye pain, redness and discharge. ; ; ENMT: Negative for ear pain, hoarseness, nasal congestion, sinus pressure and sore throat. ; ; Cardiovascular: Negative for chest pain, palpitations, diaphoresis, dyspnea and peripheral edema. ; ; Respiratory: Negative for cough, wheezing and stridor. ; ; Gastrointestinal: +N/V/D, abd pain. Negative for blood in stool, hematemesis, jaundice and rectal bleeding.  . ; ; Genitourinary: Negative for dysuria, flank pain and hematuria. ; ; Musculoskeletal: Negative for back pain and neck pain. Negative for swelling and trauma.; ; Skin: Negative for pruritus, rash, abrasions, blisters, bruising and skin lesion.; ; Neuro: Negative for headache, lightheadedness and neck stiffness. Negative for weakness, altered level of consciousness , altered mental status, extremity weakness, paresthesias, involuntary movement, seizure and syncope.      Allergies  Review of patient's allergies indicates no known allergies.  Home Medications   Current Outpatient Rx  Name  Route  Sig  Dispense  Refill  . acetaminophen (TYLENOL) 325 MG tablet   Oral   Take 650 mg by mouth every 6 (six) hours as needed (pain).          BP 124/71  Pulse 80  Temp(Src) 98.6 F (37 C) (Oral)  Resp 16  Ht 5\' 4"  (1.626 m)  SpO2 100%  LMP 02/23/2014 Physical Exam 2140: Physical examination:  Nursing notes reviewed; Vital signs and O2 SAT reviewed;  Constitutional: Well developed, Well nourished, Well hydrated, In no acute distress; Head:  Normocephalic, atraumatic; Eyes: EOMI, PERRL, No scleral icterus; ENMT: Mouth and pharynx normal, Mucous membranes moist; Neck: Supple, Full range of motion, No lymphadenopathy; Cardiovascular: Regular rate and rhythm, No murmur, rub, or gallop; Respiratory:  Breath sounds clear & equal bilaterally, No rales, rhonchi, wheezes.  Speaking full sentences with ease, Normal respiratory effort/excursion; Chest: Nontender, Movement normal; Abdomen: Soft, +mild diffuse tenderness to palp. No rebound or guarding. Nondistended, Normal bowel sounds; Genitourinary: No CVA tenderness; Extremities: Pulses normal, No tenderness, No edema, No calf edema or asymmetry.; Neuro: AA&Ox3, Major CN grossly intact.  Speech clear. No gross focal motor or sensory deficits in extremities.; Skin: Color normal, Warm, Dry.    ED Course  Procedures    EKG Interpretation None       MDM  MDM Reviewed: previous chart, nursing note and vitals Reviewed previous: labs Interpretation: labs, x-ray and CT scan   Results for orders placed during the hospital encounter of 02/25/14  CBC WITH DIFFERENTIAL      Result Value Ref Range   WBC 4.3  4.0 - 10.5 K/uL   RBC 4.46  3.87 - 5.11 MIL/uL   Hemoglobin 13.3  12.0 - 15.0 g/dL   HCT 16.1  09.6 - 04.5 %   MCV 90.4  78.0 - 100.0 fL   MCH 29.8  26.0 - 34.0 pg   MCHC 33.0  30.0 - 36.0 g/dL   RDW 40.9  81.1 - 91.4 %   Platelets 249  150 - 400 K/uL   Neutrophils Relative % 40 (*) 43 - 77 %   Neutro Abs 1.7  1.7 - 7.7 K/uL   Lymphocytes Relative 48 (*) 12 - 46 %   Lymphs Abs 2.0  0.7 - 4.0 K/uL   Monocytes Relative 9  3 - 12 %   Monocytes Absolute 0.4  0.1 - 1.0 K/uL   Eosinophils Relative 2  0 - 5 %   Eosinophils Absolute 0.1  0.0 - 0.7 K/uL   Basophils Relative 1  0 - 1 %   Basophils Absolute 0.0  0.0 - 0.1 K/uL  COMPREHENSIVE METABOLIC PANEL      Result Value Ref Range   Sodium 140  137 - 147 mEq/L   Potassium 3.5 (*) 3.7 - 5.3 mEq/L   Chloride 102  96 - 112 mEq/L   CO2 23  19 - 32 mEq/L   Glucose, Bld 90  70 - 99 mg/dL   BUN 10  6 - 23 mg/dL   Creatinine, Ser 7.82  0.50 - 1.10 mg/dL   Calcium 9.3  8.4 - 95.6 mg/dL   Total Protein 7.8  6.0 - 8.3 g/dL   Albumin 4.0  3.5 - 5.2 g/dL   AST 27  0 - 37 U/L   ALT 31  0 - 35 U/L   Alkaline Phosphatase 94  39 - 117 U/L   Total Bilirubin 0.4  0.3 - 1.2 mg/dL   GFR calc non Af Amer 84 (*) >90 mL/min   GFR calc Af Amer >90  >90 mL/min  LIPASE, BLOOD      Result Value Ref Range   Lipase 19  11 - 59 U/L  URINALYSIS, ROUTINE W REFLEX MICROSCOPIC      Result Value Ref Range   Color, Urine YELLOW  YELLOW   APPearance CLEAR  CLEAR   Specific Gravity, Urine >1.046 (*) 1.005 - 1.030   pH 6.0  5.0 - 8.0   Glucose, UA NEGATIVE  NEGATIVE mg/dL   Hgb urine dipstick NEGATIVE  NEGATIVE   Bilirubin Urine NEGATIVE  NEGATIVE   Ketones, ur NEGATIVE  NEGATIVE mg/dL   Protein, ur  NEGATIVE  NEGATIVE mg/dL   Urobilinogen, UA 0.2  0.0 - 1.0 mg/dL   Nitrite NEGATIVE  NEGATIVE   Leukocytes, UA NEGATIVE  NEGATIVE  PREGNANCY, URINE      Result Value Ref Range   Preg Test, Ur NEGATIVE  NEGATIVE   Dg Chest 2 View 02/25/2014   CLINICAL DATA:  Abdominal pain  EXAM: CHEST  2 VIEW  COMPARISON:  None.  FINDINGS: The cardiac and mediastinal silhouettes are within normal limits.  The lungs are normally inflated. No airspace consolidation, pleural effusion, or pulmonary edema is identified. There is no pneumothorax.  No acute osseous abnormality identified.  IMPRESSION: No active cardiopulmonary disease.   Electronically Signed   By: Rise Mu M.D.   On: 02/25/2014 22:29   Ct Abdomen Pelvis W Contrast 02/25/2014   CLINICAL DATA:  Mid abdominal pain, vomiting and diarrhea for 6 weeks.  EXAM: CT ABDOMEN AND PELVIS WITH CONTRAST  TECHNIQUE: Multidetector CT imaging of the abdomen and pelvis was performed using the standard protocol following bolus administration of intravenous contrast.  CONTRAST:  100 cc Omnipaque 300.  COMPARISON:  None.  FINDINGS: Lung bases are clear.  No pleural or pericardial effusion.  The gallbladder, liver, spleen, adrenal glands, pancreas and kidneys are all unremarkable. Uterus and adnexa appear normal. Urinary bladder is decompressed but otherwise unremarkable. The stomach and small and large bowel appear normal. There is no lymphadenopathy or fluid. No focal bony abnormality.  IMPRESSION: Negative abdomen and pelvis CT scan.   Electronically Signed   By: Drusilla Kanner M.D.   On: 02/25/2014 23:06    2355:  Pt has tol PO well while in the ED without N/V.  No stooling while in the ED.  Abd remains benign, VSS. Feels better after meds and wants to go home now.  Workup reassuring; will have pt f/u with GI MD. Dx and testing d/w pt and family.  Questions answered.  Verb understanding, agreeable to d/c home with outpt f/u.   Laray Anger, DO 02/28/14  1337

## 2014-02-26 NOTE — Discharge Instructions (Signed)
°Emergency Department Resource Guide °1) Find a Doctor and Pay Out of Pocket °Although you won't have to find out who is covered by your insurance plan, it is a good idea to ask around and get recommendations. You will then need to call the office and see if the doctor you have chosen will accept you as a new patient and what types of options they offer for patients who are self-pay. Some doctors offer discounts or will set up payment plans for their patients who do not have insurance, but you will need to ask so you aren't surprised when you get to your appointment. ° °2) Contact Your Local Health Department °Not all health departments have doctors that can see patients for sick visits, but many do, so it is worth a call to see if yours does. If you don't know where your local health department is, you can check in your phone book. The CDC also has a tool to help you locate your state's health department, and many state websites also have listings of all of their local health departments. ° °3) Find a Walk-in Clinic °If your illness is not likely to be very severe or complicated, you may want to try a walk in clinic. These are popping up all over the country in pharmacies, drugstores, and shopping centers. They're usually staffed by nurse practitioners or physician assistants that have been trained to treat common illnesses and complaints. They're usually fairly quick and inexpensive. However, if you have serious medical issues or chronic medical problems, these are probably not your best option. ° °No Primary Care Doctor: °- Call Health Connect at  832-8000 - they can help you locate a primary care doctor that  accepts your insurance, provides certain services, etc. °- Physician Referral Service- 1-800-533-3463 ° °Chronic Pain Problems: °Organization         Address  Phone   Notes  °[facility]  (336) 297-2271 Patients need to be referred by their primary care doctor.  ° °Medication  Assistance: °Organization         Address  Phone   Notes  °[place] [facility] [address] °[place], [place] [address] (336) 641-8030 --Must be a resident of [place] °-- Must have NO insurance coverage whatsoever (no Medicaid/ Medicare, etc.) °-- The pt. MUST have a primary care doctor that directs their care regularly and follows them in the community °  °[facility]  (866) 331-1348   °[employer]  (888) 892-1162   ° °Agencies that provide inexpensive medical care: °Organization         Address  Phone   Notes  °[facility]  (336) 832-8035   °[facility]    (336) 832-7272   °[facility] [address] °[place], [place] [address] (336) 832-4777   °[facility] [address], °[place] (336) 271-4999   °[facility]    (336) 373-0678   °[facility]    (336) 272-1050   °[facility] ° [address], [place] Phone:  (336) 832-4444, Fax:  (336) 832-4440 Hours of Operation:  9 am - 6 pm, M-F.  Also accepts Medicaid/Medicare and self-pay.  °[facility] ° [address], [place] Phone: (336) 832-3150, Fax: (336) 832-3151. Hours of Operation:  8:30 am - 5:30 pm, M-F.  Also accepts Medicaid and self-pay.  °[facility] [place] [address], [place] Phone: (336) 878-6027   °[facility] [address], [place], [place] (336)723-1848, Ext. 123 Mondays & Thursdays: 7-9 AM.  First 15 patients are seen on a first come, first serve basis. °  ° °Medicaid-accepting [place] Providers: ° °Organization         Address  Phone   Notes  °[facility] [address], [place] (336) 641-2100 Also accepts self-pay patients.  °[facility] [address], [place] ° (336) 856-9996   °[facility] [address], [place]  (336) 288-8857   °[facility] [address] [place] [address], [place] (336) 299-7000   °Veita Bland [address], [place]  ° (336) 373-1557 Only accepts [place] Access Medicaid patients after they have their name applied to their card.  ° °Self-Pay (no insurance) in [place]: ° °Organization         Address  Phone   Notes  °Sickle Cell Patients, [facility] [address], [place] (336) 832-1970   °[facility] [address], [place] (336) 832-4400   °[facility] ° [address], [place] (336) 992-4800   °[facility]/Dr. Osei-Bonsu ° [address] [place] [address], [place] or [address], [place] (336) 841-8500 Phone number for both [place] and [place] locations is the same.  °[facility] [address], [place] (336) 299-0000   °[facility] [address] [place] [address], [place] or [address] (336) 852-7530 °(336) 878-2260   °[facility] [address], [place] (336) 350-1642, phone; (336) 294-5005, fax Sees patients 1st and 3rd Saturday of every month.  Must not qualify for public or private insurance (i.e. Medicaid, Medicare, [place] Health Choice, Veterans' Benefits) • Household income should be no more than 200% of the poverty level •The clinic cannot treat you if you are pregnant or think you are pregnant • Sexually transmitted diseases are not treated at the clinic.  ° ° °Dental Care: °Organization         Address  Phone  Notes  °[place] [facility] [address], [place] (336) 641-6152 Accepts children up to age 21 who are enrolled in Medicaid or [place] Health Choice; pregnant women with a Medicaid card; and children who have applied for Medicaid or [place] Health Choice, but were declined, whose parents can pay a reduced fee at time of service.  °[place]  [facility] [place]  [address], [place] (336) 641-7733 Accepts children up to age 21 who are enrolled in Medicaid or [place] Health Choice; pregnant women with a Medicaid card; and children who have applied for Medicaid or [place] Health Choice, but were declined, whose parents can pay a reduced fee at time of service.  °[facility] ° [address], [place] (336) 641-4533 Patients are seen by appointment only. Walk-ins are not accepted. [facility] will see patients 18 years of age and older. °Monday - Tuesday (8am-5pm) °Most Wednesdays (8:30-5pm) °$30 per visit, cash only  °[facility] ° [address], [place] (336) 641-4533 Patients are seen by appointment only. Walk-ins are not accepted. [facility] will see patients 18 years of age and older. °One   Wednesday Evening (Monthly: Volunteer Based).  $30 per visit, cash only  °[facility]  (919) 537-3737 for adults; Children under age 4, call Graduate Pediatric Dentistry at (919) [phone]. Children aged 4-14, please call (919) [phone] to request a pediatric application. ° Dental services are provided in all areas of dental care including fillings, crowns and bridges, complete and partial dentures, implants, gum treatment, root canals, and extractions. Preventive care is also provided. Treatment is provided to both adults and children. °Patients are selected via a lottery and there is often a waiting list. °  °[facility] [address], °[place] ° (336) 763-8833 [email or web address] °  °[facility] [address], [place], [place] (336)723-1848, Ext. 123 Second and Fourth Thursday of each month, opens at 6:30 AM; Clinic ends at 9 AM.  Patients are seen on a first-come first-served basis, and a limited number are seen during each clinic.  ° °[facility] ° [address], [place], [place] (336) 723-7904    Eligibility Requirements °You must have lived in [place], [place], or [place] counties for at least the last three months. °  You cannot be eligible for state or federal sponsored healthcare insurance, including Veterans Administration, Medicaid, or Medicare. °  You generally cannot be eligible for healthcare insurance through your employer.  °  How to apply: °Eligibility screenings are held every Tuesday and Wednesday afternoon from 1:00 pm until 4:00 pm. You do not need an appointment for the interview!  °[facility] [address], [place], [place] 336-631-2330   °[facility]  336-342-8273   °[facility]  336-703-3100   °[facility]  336-570-6415   ° °Behavioral Health Resources in the Community: °Intensive Outpatient Programs °Organization         Address  Phone  Notes  °[facility] [address], [place], [place] 336-878-6098   °[facility] [address], [place], [place] 336-832-9800   °[facility] [address], [place], [place] ° 336-882-2125   °[facility] [address],  °[place], [place] 1-800-853-5163 or 336-641-4981   °Substance Abuse Resources °Organization         Address  Phone  Notes  °[facility]  336-882-2125   °[facility]  336-784-9470   °[facility]  336-285-9073   °[facility]  336-845-3988   °Residential & Outpatient Substance Abuse Program  1-800-659-3381   °Psychological Services °Organization         Address  Phone  Notes  °[facility]  336- 832-9600   °[facility]  336- 378-7881   °[facility] [address], [place] 1-800-853-5163 or 336-641-4981   ° °Mobile Crisis Teams °Organization         Address  Phone  Notes  °[facility], Mobile Crisis Care Unit  1-877-626-1772   °Assertive °[facility] ° [address]  [place], [place] 336-834-9664   °Sharon DeEsch [address] °[place] 336-554-5454   ° °Self-Help/Support Groups °Organization         Address  Phone             Notes  °[facility] of  - variety of support groups  336- 373-1402 Call for more information  °Narcotics Anonymous (NA), [facility] [address], °[place]  2 meetings at this location  ° °  Residential Treatment Programs Organization         Address  Phone  Notes  [facility] [address],    [place]  [phone]   [facility]  [address], [place] [phone], [place], [place] [phone]   [facility] [address], [place] [phone] Admissions: 8am-3pm M-F  [facility] [address]    [place], [place] 295-284-1324([phone]   [facility] [address], [place], [place] [phone]   [facility] [address]  [place], [place] [phone])[phone]   [facility] Intensive Outpatient [address], [place], [place] [phone]   [facility] [address]  [place], [place] [phone])[phone] or (315) [phone]   [facility] ([facility]) [address] [place], [place] 323-557-3220(317) 788-3841 Accepts Medicaid  [facility] [address]  [place] [phone] Substance Abuse/Addiction Treatment   [facility]         Address  Phone  Notes  [facility]  202-125-7323(888) [phone]   Angie FavaJulie Brannon, PhD [address], [address] [place], [place]   [phone](336) (940)[phone] or [phone](336) [phone]   [facility] [place]   [address] [place], [place] ([phone](336) [phone]   [facility] [address], [place], [place] [phone](336) [phone] Insurance/Medicaid/sponsorship through [facility] [address]                                    [place], [place] [phone]) [phone] Therapy/tele-psych/case    [facility] [address]   [place], [place] [phone](336) [phone]    Dr. Lolly MustacheArfeen  [phone]) [phone]   [facility]  [employer] [facility] 1) [address], [place] 2) [address], [place] 3)  [address], [place] [phone](336) [phone] [phone](336) [phone]  [phone](336) [phone]   [facility] Child Abuse Hotline (202)492-8701(336) 463 235 8457 or (715)[phone](336) [phone] (After Hours)       Eat a bland diet, avoiding greasy, fatty, fried foods, as well as spicy and acidic foods or beverages.  Avoid eating within the hour or 2 before going to bed or laying down.  Also avoid teas, colas, coffee, chocolate, pepermint and spearment.  Take over the counter pepcid, one tablet by mouth twice a day, for the next 2 to 3 weeks.  May also take over the counter maalox/mylanta, as directed on packaging, as needed for discomfort. Take the prescriptions as directed.  Avoid full strength juices, as well as milk and milk products until your diarrhea has resolved.   Call your regular medical doctor and the GI doctor tomorrow to schedule a follow up appointment this week.  Return to the Emergency Department immediately if not improving (or even worsening) despite taking the medicines as prescribed, any black or bloody stool or vomit, if you develop a fever over "101," or for any other concerns.

## 2014-07-03 ENCOUNTER — Emergency Department (HOSPITAL_COMMUNITY)
Admission: EM | Admit: 2014-07-03 | Discharge: 2014-07-03 | Disposition: A | Discharge status: Home / Self Care | Payer: Medicaid Other | Attending: Emergency Medicine | Admitting: Emergency Medicine

## 2014-07-03 ENCOUNTER — Encounter (HOSPITAL_COMMUNITY): Payer: Self-pay | Admitting: Emergency Medicine

## 2014-07-03 ENCOUNTER — Emergency Department (HOSPITAL_COMMUNITY): Payer: Medicaid Other

## 2014-07-03 DIAGNOSIS — M25462 Effusion, left knee: Secondary | ICD-10-CM

## 2014-07-03 DIAGNOSIS — Z8709 Personal history of other diseases of the respiratory system: Secondary | ICD-10-CM | POA: Insufficient documentation

## 2014-07-03 DIAGNOSIS — Z87440 Personal history of urinary (tract) infections: Secondary | ICD-10-CM | POA: Diagnosis not present

## 2014-07-03 DIAGNOSIS — Z79899 Other long term (current) drug therapy: Secondary | ICD-10-CM | POA: Diagnosis not present

## 2014-07-03 DIAGNOSIS — Z87891 Personal history of nicotine dependence: Secondary | ICD-10-CM | POA: Diagnosis not present

## 2014-07-03 DIAGNOSIS — Z8679 Personal history of other diseases of the circulatory system: Secondary | ICD-10-CM | POA: Diagnosis not present

## 2014-07-03 DIAGNOSIS — M25469 Effusion, unspecified knee: Secondary | ICD-10-CM | POA: Insufficient documentation

## 2014-07-03 DIAGNOSIS — M25569 Pain in unspecified knee: Secondary | ICD-10-CM | POA: Diagnosis present

## 2014-07-03 MED ORDER — NAPROXEN 500 MG PO TABS
500.0000 mg | ORAL_TABLET | Freq: Once | ORAL | Status: AC
  Administered 2014-07-03: 500 mg via ORAL
  Filled 2014-07-03: qty 1

## 2014-07-03 MED ORDER — MELOXICAM 15 MG PO TABS: 15.0000 mg | ORAL_TABLET | Freq: Every day | ORAL | Status: DC

## 2014-07-03 NOTE — ED Notes (Signed)
Bed: WTR5 Expected date:  Expected time:  Means of arrival:  Comments: 

## 2014-07-03 NOTE — Discharge Instructions (Signed)
Please read and follow all provided instructions.  Your diagnoses today include:  1. Knee effusion, left     Tests performed today include:  An x-ray of the affected area - does NOT show any broken bones, shows fluid in the joints  Vital signs. See below for your results today.   Medications prescribed:   Meloxicam - anti-inflammatory pain medication  You have been prescribed an anti-inflammatory medication or NSAID. Take with food. Do not take aspirin, ibuprofen, or naproxen if taking this medication. Take smallest effective dose for the shortest duration needed for your pain. Stop taking if you experience stomach pain or vomiting.   Take any prescribed medications only as directed.  Home care instructions:   Follow any educational materials contained in this packet  Follow R.I.C.E. Protocol:  R - rest your injury   I  - use ice on injury without applying directly to skin  C - compress injury with bandage or splint  E - elevate the injury as much as possible  Follow-up instructions: Please follow-up with your primary care provider or the provided orthopedic physician (bone specialist) if you continue to have significant pain or trouble walking in 1 week.   Return instructions:   Please return if your toes are numb or tingling, appear gray or blue, or you have severe pain (also elevate leg and loosen splint or wrap if you were given one)  Please return to the Emergency Department if you experience worsening symptoms.   Please return if you have any other emergent concerns.  Additional Information:  Your vital signs today were: BP 128/97   Pulse 71   Temp(Src) 98.4 F (36.9 C) (Oral)   Resp 18   SpO2 100%   LMP 06/19/2014 If your blood pressure (BP) was elevated above 135/85 this visit, please have this repeated by your doctor within one month. --------------

## 2014-07-03 NOTE — ED Provider Notes (Signed)
CSN: [id number]     Arrival date & time 07/03/14  1101 History   First MD Initiated Contact with Patient 07/03/14 1133     Chief Complaint  Patient presents with  . Knee Pain     (Consider location/radiation/quality/duration/timing/severity/associated sxs/prior Treatment) HPI Comments: Patient presents with complaint of left knee pain for one month. No injury at onset. Patient has noted swelling of her knee without redness, fever, nausea or vomiting. Penis worse with standing and movement. It is better with rest. She's been taking occasional over-the-counter ibuprofen without relief. She does not have a history of gout. No history of diabetes. She has pain that radiates down her calf at times. No calf swelling. The onset of this condition was gradual. The course is constant.   Patient is a 28 y.o. female presenting with knee pain. The history is provided by the patient.  Knee Pain Associated symptoms: no back pain and no neck pain     Past Medical History  Diagnosis Date  . Bronchitis   . Preterm labor   . Pregnancy induced hypertension     induced for PIH with first  . Infection     UTI   Past Surgical History  Procedure Laterality Date  . Cesarean section     Family History  Problem Relation Age of Onset  . Hypertension Mother   . Heart disease Father   . Diabetes Sister   . Heart disease Paternal Grandfather   . Heart disease Paternal Grandmother    History  Substance Use Topics  . Smoking status: Former Games developer  . Smokeless tobacco: Never Used     Comment: quit 5 yrs ago  . Alcohol Use: No   OB History   Grav Para Term Preterm Abortions TAB SAB Ect Mult Living   4 4 2 2      4      Review of Systems  Constitutional: Negative for activity change.  Musculoskeletal: Positive for arthralgias and joint swelling. Negative for back pain and neck pain.  Skin: Negative for wound.  Neurological: Negative for weakness and numbness.    Allergies  Review of patient's  allergies indicates no known allergies.  Home Medications   Prior to Admission medications   Medication Sig Start Date End Date Taking? Authorizing Provider  norgestimate-ethinyl estradiol (SPRINTEC 28) 0.25-35 MG-MCG tablet Take 1 tablet by mouth daily.   Yes Historical Provider, MD  ranitidine (ZANTAC) 150 MG tablet Take 150 mg by mouth daily.   Yes Historical Provider, MD   BP 128/97  Pulse 71  Temp(Src) 98.4 F (36.9 C) (Oral)  Resp 18  SpO2 100%  LMP 06/26/2014  Physical Exam  Nursing note and vitals reviewed. Constitutional: She appears well-developed and well-nourished.  HENT:  Head: Normocephalic and atraumatic.  Eyes: Conjunctivae are normal. Right eye exhibits no discharge. Left eye exhibits no discharge.  Neck: Normal range of motion. Neck supple.  Cardiovascular: Normal rate, regular rhythm and normal heart sounds.   Pulses:      Dorsalis pedis pulses are 2+ on the right side, and 2+ on the left side.       Posterior tibial pulses are 2+ on the right side, and 2+ on the left side.  Pulmonary/Chest: Effort normal and breath sounds normal.  Abdominal: Soft. There is no tenderness.  Musculoskeletal: She exhibits edema and tenderness.       Left hip: Normal.       Right knee: Normal. She exhibits normal range of motion.  Left knee: She exhibits effusion (moderate). She exhibits normal range of motion and no erythema. Tenderness found. Medial joint line and lateral joint line tenderness noted.       Right ankle: Normal.       Left ankle: Normal.       Right lower leg: She exhibits no tenderness.       Left lower leg: She exhibits no tenderness.  Neurological: She is alert.  Skin: Skin is warm and dry.  Psychiatric: She has a normal mood and affect.    ED Course  Procedures (including critical care time) Labs Review Labs Reviewed - No data to display  Imaging Review Dg Knee Complete 4 Views Left  07/03/2014   CLINICAL DATA:  KNEE PAIN  EXAM: LEFT KNEE -  COMPLETE 4+ VIEW  COMPARISON:  Prior radiograph from 02/13/2013  FINDINGS: No acute fracture or dislocation. Moderate joint effusion present within the suprapatellar recess. Joint spaces are well maintained without radiographic evidence of significant degenerative or erosive arthropathy. No focal osseous lesion.  Osseous mineralization is normal.  No soft tissue abnormality.  IMPRESSION: 1. No acute fracture or dislocation. 2. Moderate joint effusion within the suprapatellar recess.   Electronically Signed   By: Rise MuBenjamin  McClintock M.D.   On: 07/03/2014 13:02     EKG Interpretation None      11:44 AM Patient seen and examined. Work-up initiated. Medications ordered.   Vital signs reviewed and are as follows: Filed Vitals:   07/03/14 1124  BP: 128/97  Pulse: 71  Temp: 98.4 F (36.9 C)  Resp: 18   1:26 PM x-ray reviewed by myself. Patient informed of results.  Will prescribe meloxicam and give orthopedic followup.  Patient urged to return with worsening symptoms or other concerns. Patient verbalized understanding and agrees with plan.     MDM   Final diagnoses:  Knee effusion, left   Patient with knee effusion, no obvious injury. Do not suspect septic arthritis given normal range of motion. Do not suspect gout given no history of same. Will treat inflammation with NSAIDs and give followup.   No dangerous or life-threatening conditions suspected or identified by history, physical exam, and by work-up. No indications for hospitalization identified.      Renne CriglerJoshua Belicia Difatta, PA-C 07/03/14 1328

## 2014-07-03 NOTE — ED Notes (Signed)
Pt states that she has had lt knee pain x 4 years.  Denies injury. States she has never seen a doctor about it before.

## 2014-07-04 NOTE — ED Provider Notes (Signed)
Medical screening examination/treatment/procedure(s) were performed by non-physician practitioner and as supervising physician I was immediately available for consultation/collaboration.   Candyce ChurnJohn David Icelyn Navarrete III, MD 07/04/14 [phone]

## 2014-09-17 IMAGING — US US OB TRANSVAGINAL
1 series · 13 of 26 positions shown · non-contrast
Comparison: none

[Series 1: us ob transvaginal · 26 acquisitions, 13 frames shown]
[im 2/26]
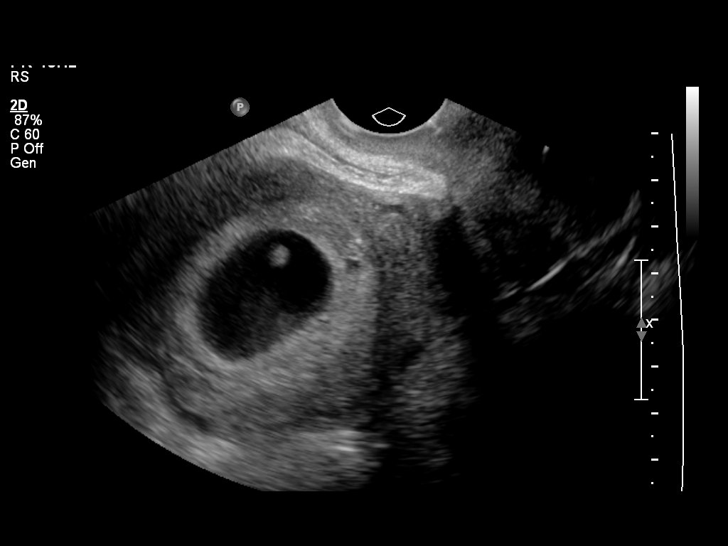
[im 4/26]
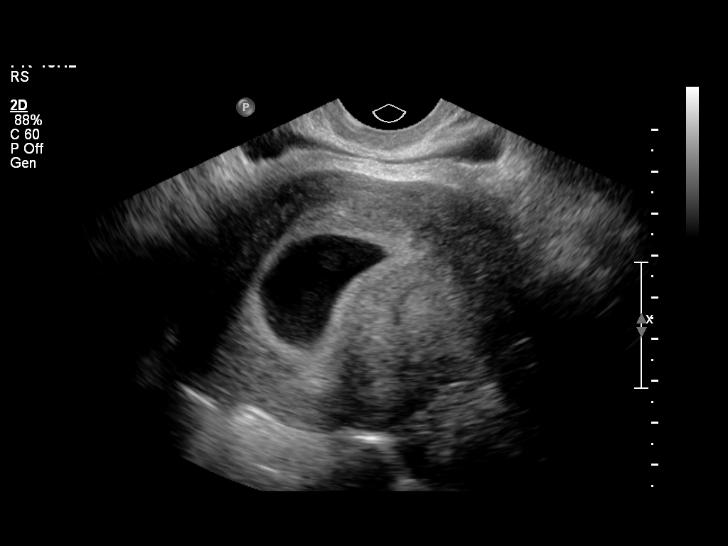
[im 6/26]
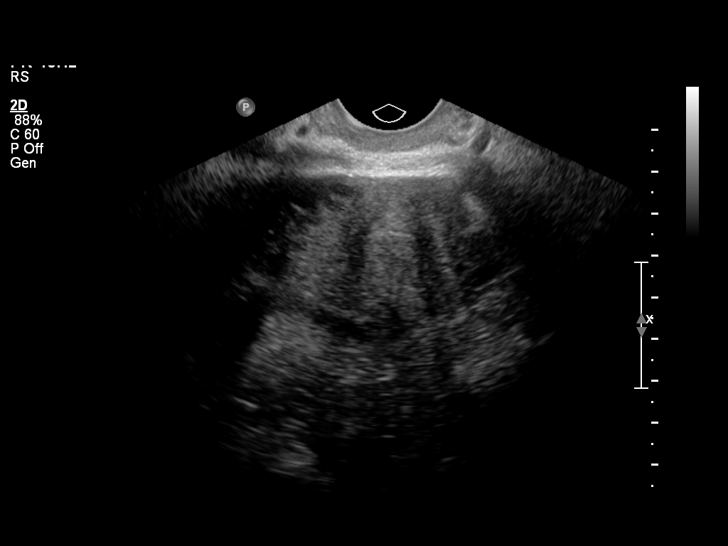
[im 8/26]
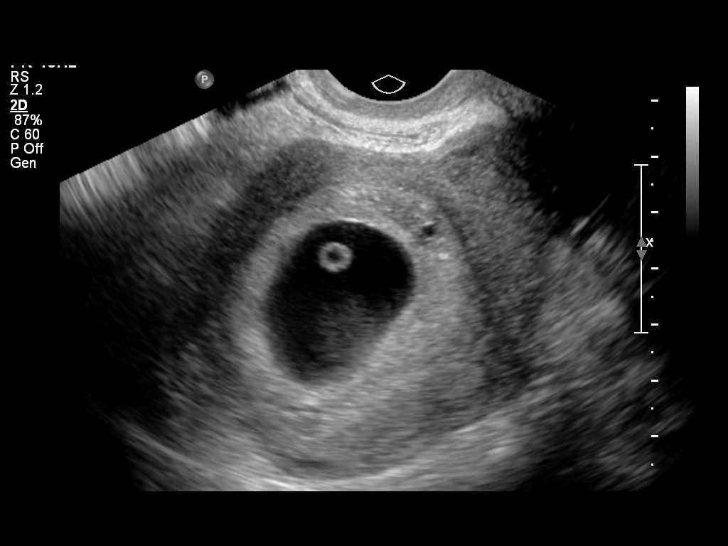
[im 10/26]
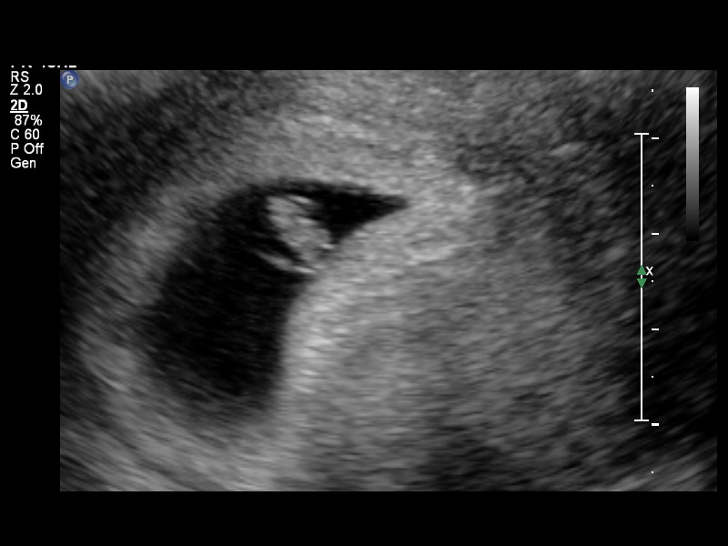
[im 12/26]
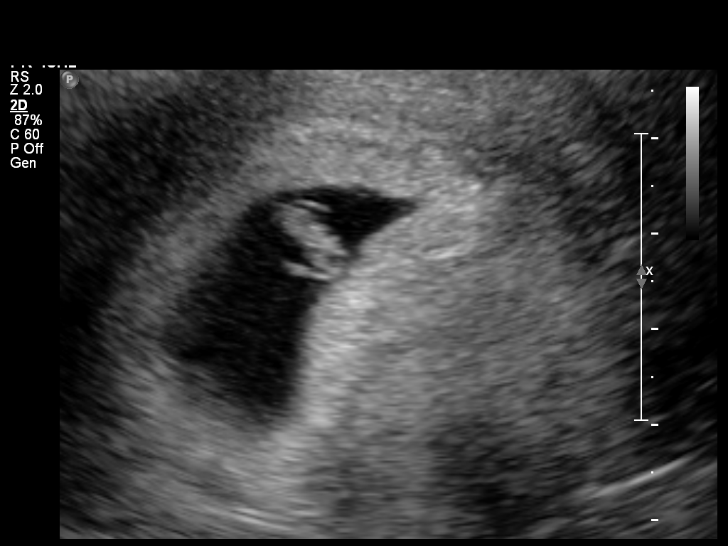
[im 14/26]
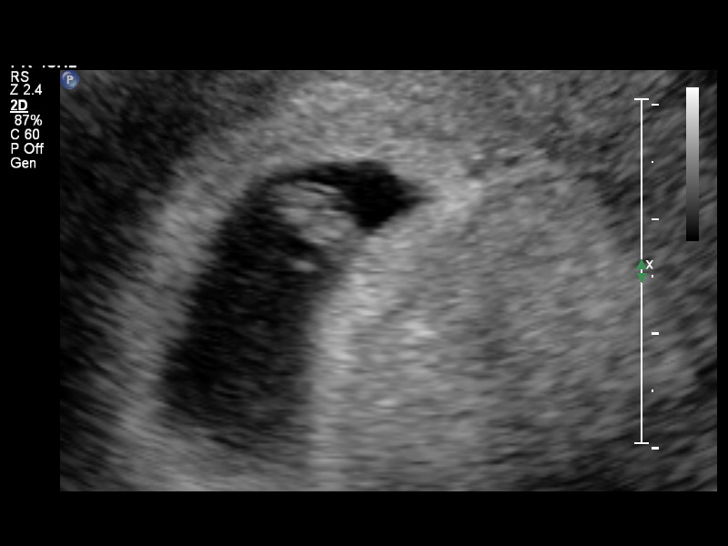
[im 16/26]
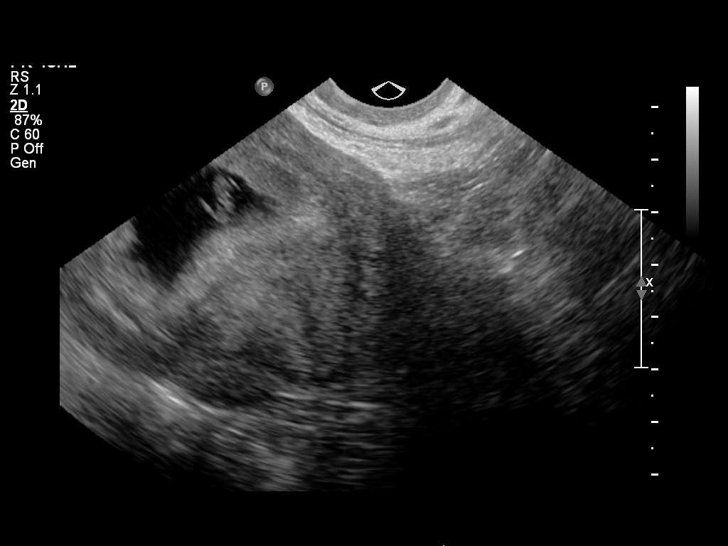
[im 18/26]
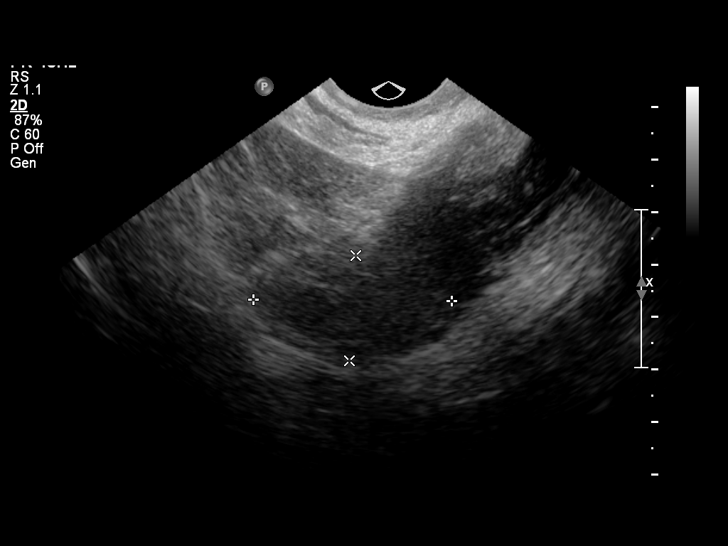
[im 20/26]
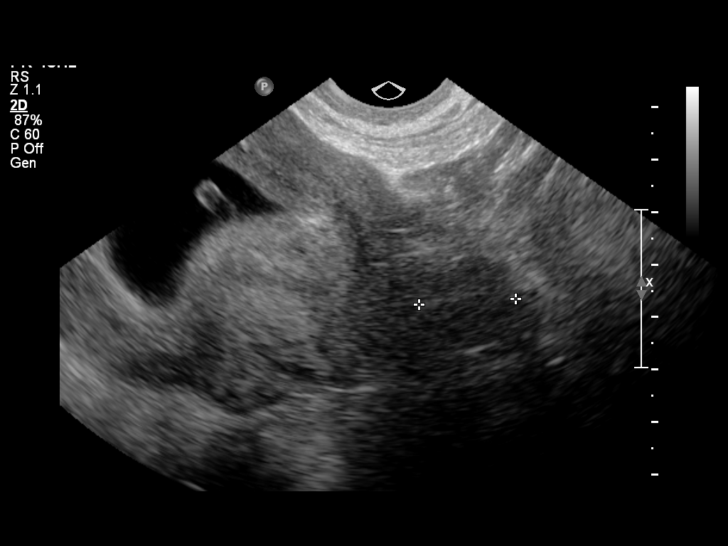
[im 22/26]
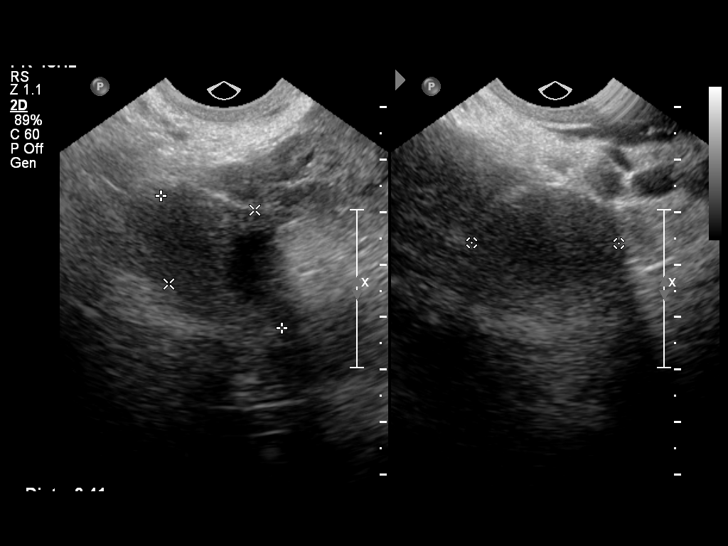
[im 24/26]
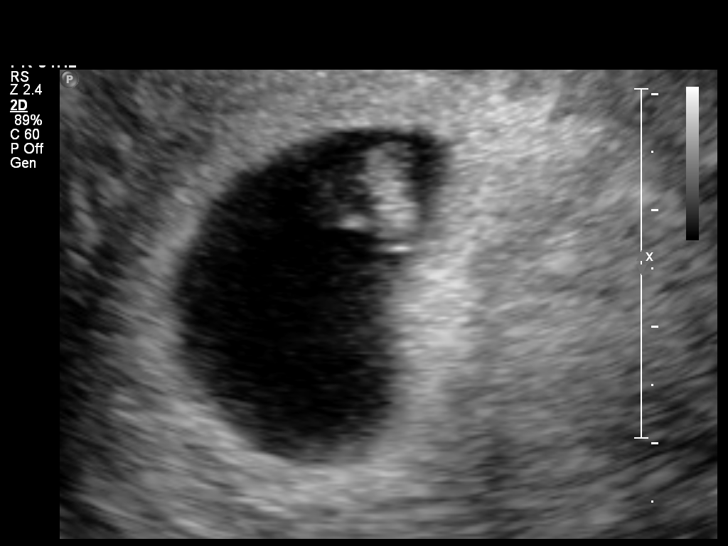
[im 26/26]
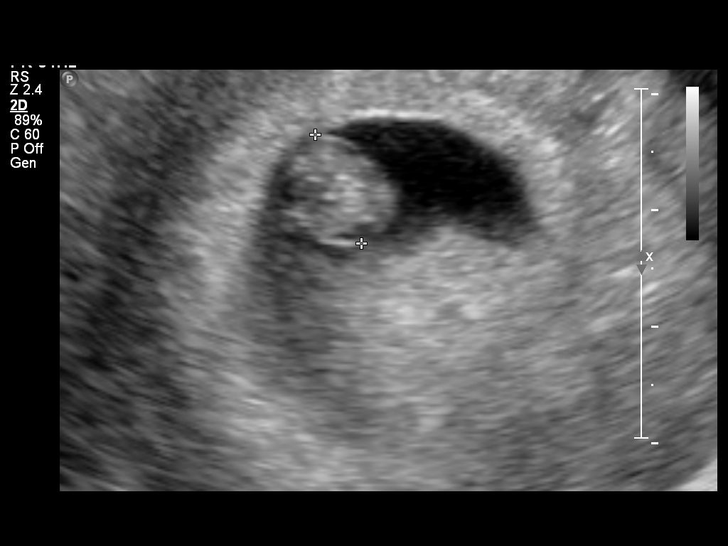

[13 of 26 positions shown; findings below may reference images not displayed]

OBSTETRICS REPORT
                      (Signed Final 05/22/2013 [DATE])

Service(s) Provided

 US OB TRANSVAGINAL                                    76817.0
Indications

 Vaginal bleeding, unknown etiology
 Pregnancy with inconclusive fetal viability
Fetal Evaluation

 Num Of Fetuses:    1
 Preg. Location:    Intrauterine
 Gest. Sac:         Intrauterine
 Yolk Sac:          Visualized
 Fetal Pole:        Visualized
 Fetal Heart Rate:  137                         bpm
 Cardiac Activity:  Observed
Biometry

 CRL:      9.6  mm    G. Age:   7w 0d                  EDD:   01/08/14
Gestational Age

 LMP:           6w 6d        Date:   04/04/13                 EDD:   01/09/14
 Best:          6w 6d     Det. By:   LMP  (04/04/13)          EDD:   01/09/14
Cervix Uterus Adnexa

 Cervix:       Normal appearance by transvaginal scan
 Uterus:       No abnormality visualized.
 Cul De Sac:   No free fluid seen.
 Left Ovary:   Within normal limits.
 Right Ovary:  Within normal limits.
 Adnexa:     No abnormality visualized.
Impression

 Single living IUP.  US EGA is concordant with LMP.
 No significant maternal uterine or adnexal abnormality
 identified.
 questions or concerns.

## 2014-10-09 ENCOUNTER — Emergency Department (INDEPENDENT_AMBULATORY_CARE_PROVIDER_SITE_OTHER)
Admission: EM | Admit: 2014-10-09 | Discharge: 2014-10-09 | Disposition: A | Discharge status: Home / Self Care | Payer: Medicaid Other | Source: Home / Self Care | Attending: Family Medicine | Admitting: Family Medicine

## 2014-10-09 ENCOUNTER — Encounter (HOSPITAL_COMMUNITY): Payer: Self-pay | Admitting: Emergency Medicine

## 2014-10-09 DIAGNOSIS — M25562 Pain in left knee: Secondary | ICD-10-CM

## 2014-10-09 DIAGNOSIS — G8929 Other chronic pain: Secondary | ICD-10-CM

## 2014-10-09 NOTE — Discharge Instructions (Signed)
Make an appointment with your primary care provider to consider starting a medication such as Cymbalta to help with this chronic knee pain.   Chronic Pain Chronic pain can be defined as pain that is off and on and lasts for 3-6 months or longer. Many things cause chronic pain, which can make it difficult to make a diagnosis. There are many treatment options available for chronic pain. However, finding a treatment that works well for you may require trying various approaches until the right one is found. Many people benefit from a combination of two or more types of treatment to control their pain. SYMPTOMS  Chronic pain can occur anywhere in the body and can range from mild to very severe. Some types of chronic pain include:  Headache.  Low back pain.  Cancer pain.  Arthritis pain.  Neurogenic pain. This is pain resulting from damage to nerves. People with chronic pain may also have other symptoms such as:  Depression.  Anger.  Insomnia.  Anxiety. DIAGNOSIS  Your health care provider will help diagnose your condition over time. In many cases, the initial focus will be on excluding possible conditions that could be causing the pain. Depending on your symptoms, your health care provider may order tests to diagnose your condition. Some of these tests may include:   Blood tests.   CT scan.   MRI.   X-rays.   Ultrasounds.   Nerve conduction studies.  You may need to see a specialist.  TREATMENT  Finding treatment that works well may take time. You may be referred to a pain specialist. He or she may prescribe medicine or therapies, such as:   Mindful meditation or yoga.  Shots (injections) of numbing or pain-relieving medicines into the spine or area of pain.  Local electrical stimulation.  Acupuncture.   Massage therapy.   Aroma, color, light, or sound therapy.   Biofeedback.   Working with a physical therapist to keep from getting stiff.   Regular,  gentle exercise.   Cognitive or behavioral therapy.   Group support.  Sometimes, surgery may be recommended.  HOME CARE INSTRUCTIONS   Take all medicines as directed by your health care provider.   Lessen stress in your life by relaxing and doing things such as listening to calming music.   Exercise or be active as directed by your health care provider.   Eat a healthy diet and include things such as vegetables, fruits, fish, and lean meats in your diet.   Keep all follow-up appointments with your health care provider.   Attend a support group with others suffering from chronic pain. SEEK MEDICAL CARE IF:   Your pain gets worse.   You develop a new pain that was not there before.   You cannot tolerate medicines given to you by your health care provider.   You have new symptoms since your last visit with your health care provider.  SEEK IMMEDIATE MEDICAL CARE IF:   You feel weak.   You have decreased sensation or numbness.   You lose control of bowel or bladder function.   Your pain suddenly gets much worse.   You develop shaking.  You develop chills.  You develop confusion.  You develop chest pain.  You develop shortness of breath.  MAKE SURE YOU:  Understand these instructions.  Will watch your condition.  Will get help right away if you are not doing well or get worse. Document Released: 08/20/2002 Document Revised: 07/31/2013 Document Reviewed: 05/24/2013 ExitCare Patient  Information 2015 [place], [place]. This information is not intended to replace advice given to you by your health care provider. Make sure you discuss any questions you have with your health care provider.

## 2014-10-09 NOTE — ED Notes (Signed)
Left knee pain for 3 months.  Patient reports she has been evaluated by [facility].  Reports she has had xrays, mri and a referral to rheumatologist at [facility] and told from rheumatologist, nothing for them and referred patient back to [facility].  [facility].  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

## 2014-10-09 NOTE — ED Provider Notes (Signed)
CSN: [id number]     Arrival date & time 10/09/14  1517 History   First MD Initiated Contact with Patient 10/09/14 1557     Chief Complaint  Patient presents with  . Knee Pain   (Consider location/radiation/quality/duration/timing/severity/associated sxs/prior Treatment) HPI          28 year old female presents complaining of left knee pain. She has had pain in her left knee for about 4 months. She has been seen by her primary care, orthopedic surgeon, as well as rheumatology. So far no one has found a reason for her knee pain. The rheumatologist referred her back to her orthopedist but she says that her Medicaid card got Messed up and she will not be able to see them for some time. She has been taking various medications which include low back, ibuprofen, etodolac, and Tylenol, without relief. Nothing helps. She had some subjective swelling around her knee and also around her lower leg. She is here because she says the pain is too bad and she wants to know what else she can do. No recent change in her pain. No injury.  Past Medical History  Diagnosis Date  . Bronchitis   . Preterm labor   . Pregnancy induced hypertension     induced for PIH with first  . Infection     UTI   Past Surgical History  Procedure Laterality Date  . Cesarean section     Family History  Problem Relation Age of Onset  . Hypertension Mother   . Heart disease Father   . Diabetes Sister   . Heart disease Paternal Grandfather   . Heart disease Paternal Grandmother    History  Substance Use Topics  . Smoking status: Former Games developermoker  . Smokeless tobacco: Never Used     Comment: quit 5 yrs ago  . Alcohol Use: No   OB History   Grav Para Term Preterm Abortions TAB SAB Ect Mult Living   4 4 2 2      4      Review of Systems  Musculoskeletal: Positive for arthralgias (left knee pain).  All other systems reviewed and are negative.   Allergies  Review of patient's allergies indicates no known  allergies.  Home Medications   Prior to Admission medications   Medication Sig Start Date End Date Taking? Authorizing Provider  meloxicam (MOBIC) 15 MG tablet Take 1 tablet (15 mg total) by mouth daily. 07/03/14   Renne CriglerJoshua Geiple, PA-C  norgestimate-ethinyl estradiol (SPRINTEC 28) 0.25-35 MG-MCG tablet Take 1 tablet by mouth daily.    Historical Provider, MD  ranitidine (ZANTAC) 150 MG tablet Take 150 mg by mouth daily.    Historical Provider, MD   BP 125/72  Pulse 75  Temp(Src) 98.4 F (36.9 C) (Oral)  Resp 16  SpO2 100% Physical Exam  Nursing note and vitals reviewed. Constitutional: She is oriented to person, place, and time. Vital signs are normal. She appears well-developed and well-nourished. No distress.  HENT:  Head: Normocephalic and atraumatic.  Pulmonary/Chest: Effort normal. No respiratory distress.  Musculoskeletal:       Left knee: She exhibits normal range of motion, no swelling, no effusion, no deformity, no erythema, normal alignment, no LCL laxity, normal patellar mobility, no bony tenderness and no MCL laxity. Tenderness found. Medial joint line and lateral joint line tenderness noted. No MCL, no LCL and no patellar tendon tenderness noted.  Neurological: She is alert and oriented to person, place, and time. She has normal strength. No sensory  deficit. Coordination normal.  Skin: Skin is warm and dry. No rash noted. She is not diaphoretic.  Psychiatric: She has a normal mood and affect. Judgment normal.    ED Course  Procedures (including critical care time) Labs Review Labs Reviewed - No data to display  Imaging Review No results found.   MDM   1. Chronic knee pain, left    I have spent a long time discussing with this patient why narcotics are not indicated for chronic pain, she understands and is in agreement with avoiding narcotics. I have recommended something such as Cymbalta to help with her pain, she will go back to her primary care doctor with this  recommendation. She should continue to pursue specialist evaluation until she has the diagnosis or treatment plan for her knee pain.       Graylon GoodZachary H Kizzi Overbey, PA-C 10/09/14 [phone]

## 2014-10-13 ENCOUNTER — Encounter (HOSPITAL_COMMUNITY): Payer: Self-pay | Admitting: Emergency Medicine

## 2015-09-07 ENCOUNTER — Emergency Department (HOSPITAL_COMMUNITY)
Admission: EM | Admit: 2015-09-07 | Discharge: 2015-09-07 | Disposition: A | Discharge status: Home / Self Care | Payer: Medicaid Other | Source: Home / Self Care | Attending: Family Medicine | Admitting: Family Medicine

## 2015-09-07 ENCOUNTER — Encounter (HOSPITAL_COMMUNITY): Payer: Self-pay | Admitting: *Deleted

## 2015-09-07 DIAGNOSIS — J0100 Acute maxillary sinusitis, unspecified: Secondary | ICD-10-CM | POA: Diagnosis not present

## 2015-09-07 MED ORDER — IPRATROPIUM BROMIDE 0.06 % NA SOLN: 2.0000 | Freq: Four times a day (QID) | NASAL | Status: DC

## 2015-09-07 MED ORDER — DOXYCYCLINE HYCLATE 100 MG PO CAPS: 100.0000 mg | ORAL_CAPSULE | Freq: Two times a day (BID) | ORAL | Status: DC

## 2015-09-07 MED ORDER — DEXTROMETHORPHAN POLISTIREX ER 30 MG/5ML PO SUER: 60.0000 mg | ORAL | Status: DC | PRN

## 2015-09-07 NOTE — ED Notes (Signed)
Pt  Has  Symptoms  Of  Cough   X  2  Weeks   With  Sensation of pressure in  The r  Ear    -  The cough   Has  Been persistant for  avbout  sev  Weeks  With  Pressure  In the r  Ear          With  Symptoms  Not  releived   By otc meds  The  Pt is  Sitting  Upright on the  Exam table  Speaking in  Complete  sentannces  In no  Distress

## 2015-09-07 NOTE — ED Provider Notes (Signed)
CSN: [id number]     Arrival date & time 09/07/15  1855 History   First MD Initiated Contact with Patient 09/07/15 2032     Chief Complaint  Patient presents with  . Cough   (Consider location/radiation/quality/duration/timing/severity/associated sxs/prior Treatment) Patient is a 29 y.o. female presenting with cough. The history is provided by the patient.  Cough Cough characteristics:  Productive, dry and hacking Sputum characteristics:  Yellow Severity:  Moderate Onset quality:  Gradual Duration:  3 weeks Progression:  Unchanged Chronicity:  Recurrent Smoker: no   Context: smoke exposure   Relieved by:  None tried Ineffective treatments:  None tried Associated symptoms: ear fullness, ear pain and rhinorrhea   Associated symptoms: no fever     Past Medical History  Diagnosis Date  . Bronchitis   . Preterm labor   . Pregnancy induced hypertension     induced for PIH with first  . Infection     UTI   Past Surgical History  Procedure Laterality Date  . Cesarean section     Family History  Problem Relation Age of Onset  . Hypertension Mother   . Heart disease Father   . Diabetes Sister   . Heart disease Paternal Grandfather   . Heart disease Paternal Grandmother    Social History  Substance Use Topics  . Smoking status: Former Games developer  . Smokeless tobacco: Never Used     Comment: quit 5 yrs ago  . Alcohol Use: No   OB History    Gravida Para Term Preterm AB TAB SAB Ectopic Multiple Living   Review of Systems  Constitutional: Negative.  Negative for fever.  HENT: Positive for congestion, ear pain, postnasal drip and rhinorrhea.   Eyes: Negative.   Respiratory: Positive for cough.   Cardiovascular: Negative.   All other systems reviewed and are negative.   Allergies  Review of patient's allergies indicates no known allergies.  Home Medications   Prior to Admission medications   Medication Sig Start Date End Date Taking? Authorizing  Provider  dextromethorphan (DELSYM) 30 MG/5ML liquid Take 10 mLs (60 mg total) by mouth as needed for cough. 09/07/15   Linna Hoff, MD  doxycycline (VIBRAMYCIN) 100 MG capsule Take 1 capsule (100 mg total) by mouth 2 (two) times daily. 09/07/15   Linna Hoff, MD  ipratropium (ATROVENT) 0.06 % nasal spray Place 2 sprays into both nostrils 4 (four) times daily. 09/07/15   Linna Hoff, MD  meloxicam (MOBIC) 15 MG tablet Take 1 tablet (15 mg total) by mouth daily. 07/03/14   Renne Crigler, PA-C  norgestimate-ethinyl estradiol (SPRINTEC 28) 0.25-35 MG-MCG tablet Take 1 tablet by mouth daily.    Historical Provider, MD  ranitidine (ZANTAC) 150 MG tablet Take 150 mg by mouth daily.    Historical Provider, MD   Meds Ordered and Administered this Visit  Medications - No data to display  BP 124/74 mmHg  Pulse 78  Temp(Src) 98.6 F (37 C) (Oral)  Resp 18  SpO2 100%  LMP 08/19/2015 No data found.   Physical Exam  Constitutional: She is oriented to person, place, and time. She appears well-developed and well-nourished. No distress.  HENT:  Right Ear: External ear normal.  Left Ear: External ear normal.  Nose: Mucosal edema and rhinorrhea present.  Mouth/Throat: Oropharynx is clear and moist.  Neck: Normal range of motion. Neck supple.  Cardiovascular: Normal heart sounds.  Pulmonary/Chest: Effort normal and breath sounds normal.  Neurological: She is alert and oriented to person, place, and time.  Skin: Skin is warm and dry.  Nursing note and vitals reviewed.   ED Course  Procedures (including critical care time)  Labs Review Labs Reviewed - No data to display  Imaging Review No results found.   Visual Acuity Review  Right Eye Distance:   Left Eye Distance:   Bilateral Distance:    Right Eye Near:   Left Eye Near:    Bilateral Near:         MDM   1. Acute maxillary sinusitis, recurrence not specified        Linna Hoff, MD 09/07/15 2048

## 2015-09-07 NOTE — Discharge Instructions (Signed)
Drink plenty of fluids as discussed, use medicine as prescribed, and mucinex for cough. Return or see your doctor if further problems °

## 2017-10-08 ENCOUNTER — Encounter (HOSPITAL_COMMUNITY): Payer: Self-pay | Admitting: Emergency Medicine

## 2017-10-08 ENCOUNTER — Ambulatory Visit (HOSPITAL_COMMUNITY)
Admission: EM | Admit: 2017-10-08 | Discharge: 2017-10-08 | Disposition: A | Discharge status: Home / Self Care | Payer: Self-pay | Attending: Internal Medicine | Admitting: Internal Medicine

## 2017-10-08 DIAGNOSIS — T23221A Burn of second degree of single right finger (nail) except thumb, initial encounter: Secondary | ICD-10-CM

## 2017-10-08 MED ORDER — IBUPROFEN 800 MG PO TABS
800.0000 mg | ORAL_TABLET | Freq: Once | ORAL | Status: AC
  Administered 2017-10-08: 800 mg via ORAL

## 2017-10-08 MED ORDER — IBUPROFEN 800 MG PO TABS
ORAL_TABLET | ORAL | Status: AC
  Filled 2017-10-08: qty 1

## 2017-10-08 MED ORDER — SILVER SULFADIAZINE 1 % EX CREA: 1.0000 "application " | TOPICAL_CREAM | Freq: Every day | CUTANEOUS | 0 refills | Status: DC

## 2017-10-08 MED ORDER — SILVER SULFADIAZINE 1 % EX CREA
TOPICAL_CREAM | Freq: Once | CUTANEOUS | Status: AC
  Administered 2017-10-08: 20:00:00 via TOPICAL

## 2017-10-08 MED ORDER — SILVER SULFADIAZINE 1 % EX CREA
TOPICAL_CREAM | CUTANEOUS | Status: AC
  Filled 2017-10-08: qty 85
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
## 2017-10-08 NOTE — Discharge Instructions (Signed)
°  You may take 500mg  acetaminophen every 4-6 hours or in combination with ibuprofen 400-600mg  every 6-8 hours as needed for pain and inflammation.  No Primary Care Doctor: Call [facility] at  [phone] - they can help you locate a primary care doctor that  accepts your insurance, provides certain services, etc. Physician Referral Service- [phone]

## 2019-06-01 ENCOUNTER — Telehealth (HOSPITAL_COMMUNITY): Payer: Self-pay | Admitting: *Deleted

## 2019-06-01 ENCOUNTER — Other Ambulatory Visit: Payer: Self-pay

## 2019-06-01 ENCOUNTER — Ambulatory Visit (HOSPITAL_COMMUNITY)
Admission: EM | Admit: 2019-06-01 | Discharge: 2019-06-01 | Disposition: A | Discharge status: Home / Self Care | Payer: Self-pay

## 2019-06-01 ENCOUNTER — Encounter (HOSPITAL_COMMUNITY): Payer: Self-pay | Admitting: Emergency Medicine

## 2019-06-01 DIAGNOSIS — Z20818 Contact with and (suspected) exposure to other bacterial communicable diseases: Secondary | ICD-10-CM

## 2019-06-01 DIAGNOSIS — J020 Streptococcal pharyngitis: Secondary | ICD-10-CM

## 2019-06-01 HISTORY — DX: Gastro-esophageal reflux disease without esophagitis: K21.9

## 2019-06-01 LAB — POCT RAPID STREP A: Streptococcus, Group A Screen (Direct): POSITIVE — AB

## 2019-06-01 MED ORDER — PENICILLIN V POTASSIUM 500 MG PO TABS: 500.0000 mg | ORAL_TABLET | Freq: Two times a day (BID) | ORAL | 0 refills | Status: AC

## 2019-06-01 MED ORDER — PENICILLIN V POTASSIUM 500 MG PO TABS: 500.0000 mg | ORAL_TABLET | Freq: Two times a day (BID) | ORAL | 0 refills | Status: DC

## 2019-06-01 NOTE — ED Triage Notes (Signed)
Complains of sore throat that started yesterday.  Patient reports a large, pus-like lump on right side of throat

## 2019-06-01 NOTE — ED Provider Notes (Signed)
[facility]    CSN: [id number] Arrival date & time: 06/01/19  1022     History   Chief Complaint Chief Complaint  Patient presents with  . Sore Throat    HPI Kristen Warner is a 33 y.o. female with history of tonsillitis presenting for throat pain and swelling.  Patient states that symptom onset was 2 days ago, started with right-sided sore throat.  Patient endorsing tonsillar edema, white patches, decreased appetite, subjective fever.  Patient is taken ibuprofen and Tylenol with adequate relief of symptoms.  Patient states that she has had several family members who tested positive for strep, was treated with amoxicillin.  Patient feels like she is also having bad breath.  Patient denies history of tonsillar or pharyngeal abscess, choking, ear, nose discomfort, cough, abdominal pain    Past Medical History:  Diagnosis Date  . Bronchitis   . GERD (gastroesophageal reflux disease)   . Infection    UTI  . Pregnancy induced hypertension    induced for PIH with first  . Preterm labor     Patient Active Problem List   Diagnosis Date Noted  . PROM (premature rupture of membranes) 01/12/2014  . VBAC (vaginal birth after Cesarean) 01/12/2014  . GBS (group B streptococcus) UTI complicating pregnancy 09/01/2013  . Previous cesarean delivery x 2--prior vaginal delivery, desires VBAC. 09/01/2013  . Gestational hypertension with 1st pregnancy 09/01/2013  . Obesity, unspecified 09/01/2013  . Hx of preterm delivery, currently pregnant 09/01/2013    Past Surgical History:  Procedure Laterality Date  . CESAREAN SECTION      OB History    Gravida  4   Para  4   Term  2   Preterm  2   AB      Living  4     SAB      TAB      Ectopic      Multiple      Live Births  1            Home Medications    Prior to Admission medications   Medication Sig Start Date End Date Taking? Authorizing Provider  acetaminophen (TYLENOL) 325 MG tablet Take 650 mg by  mouth every 6 (six) hours as needed.   Yes [provider]  ibuprofen (ADVIL) 200 MG tablet Take 200 mg by mouth every 6 (six) hours as needed.   Yes [provider]  NON FORMULARY Reflux medicine randomly   Yes [provider]  penicillin v potassium (VEETID) 500 MG tablet Take 1 tablet (500 mg total) by mouth 2 (two) times a day for 10 days. 06/01/19 06/11/19  Hall-Potvin, [place], PA-C  norgestimate-ethinyl estradiol (SPRINTEC 28) 0.25-35 MG-MCG tablet Take 1 tablet by mouth daily.  06/01/19  [provider]  ranitidine (ZANTAC) 150 MG tablet Take 150 mg by mouth daily.  06/01/19  [provider]    Family History Family History  Problem Relation Age of Onset  . Hypertension Mother   . Heart disease Father   . Diabetes Sister   . Heart disease Paternal Grandfather   . Heart disease Paternal Grandmother     Social History Social History   Tobacco Use  . Smoking status: Former Games developermoker  . Smokeless tobacco: Never Used  . Tobacco comment: quit 5 yrs ago  Substance Use Topics  . Alcohol use: No  . Drug use: No     Allergies   Patient has no known allergies.  Review of Systems As per HPI   Physical Exam Triage Vital Signs ED Triage Vitals  Enc Vitals Group     BP      Pulse      Resp      Temp      Temp src      SpO2      Weight      Height      Head Circumference      Peak Flow      Pain Score      Pain Loc      Pain Edu?      Excl. in [place]?    No data found.  Updated Vital Signs BP 112/69 (BP Location: Right Arm) Comment (BP Location): large cuff  Pulse 84   Temp 99.2 F (37.3 C) (Oral) Comment: ibuprofen 9 am  Resp 18   LMP 05/06/2019   SpO2 99%   Visual Acuity Right Eye Distance:   Left Eye Distance:   Bilateral Distance:    Right Eye Near:   Left Eye Near:    Bilateral Near:     Physical Exam Constitutional:      General: She is not in acute distress. HENT:     Head: Normocephalic and  atraumatic.     Right Ear: Tympanic membrane and ear canal normal. Tympanic membrane is not erythematous.     Left Ear: Tympanic membrane and ear canal normal. Tympanic membrane is not erythematous.     Nose: Congestion present. No rhinorrhea.     Mouth/Throat:     Mouth: Mucous membranes are moist.     Pharynx: Posterior oropharyngeal erythema present. No pharyngeal swelling or uvula swelling.     Tonsils: Tonsillar exudate present. No tonsillar abscesses. 3+ on the right. 3+ on the left.  Eyes:     General: No scleral icterus.    Conjunctiva/sclera: Conjunctivae normal.     Pupils: Pupils are equal, round, and reactive to light.  Neck:     Musculoskeletal: Normal range of motion and neck supple.     Thyroid: No thyromegaly.     Comments: Tender on right anterior cervical chain Cardiovascular:     Rate and Rhythm: Normal rate.  Pulmonary:     Effort: Pulmonary effort is normal. No respiratory distress.  Lymphadenopathy:     Cervical: Cervical adenopathy present.  Skin:    Capillary Refill: Capillary refill takes less than 2 seconds.     Coloration: Skin is not jaundiced or pale.  Neurological:     General: No focal deficit present.     Mental Status: She is alert and oriented to person, place, and time.  Psychiatric:        Mood and Affect: Mood normal.      UC Treatments / Results  Labs (all labs ordered are listed, but only abnormal results are displayed) Labs Reviewed - No data to display  EKG None  Radiology No results found.  Procedures Procedures (including critical care time)  Medications Ordered in UC Medications - No data to display  Initial Impression / Assessment and Plan / UC Course  I have reviewed the triage vital signs and the nursing notes.  Pertinent labs & imaging results that were available during my care of the patient were reviewed by me and considered in my medical decision making (see chart for details).     34 year old female with  history of tonsillitis presenting for sore throat x2 days.  Family members with known  streptococcal infection that resolved with amoxicillin.  POCT strep positive, will treat with penicillin x10 days.  Return precautions discussed, patient verbalized understanding. Final Clinical Impressions(s) / UC Diagnoses   Final diagnoses:  Strep pharyngitis     Discharge Instructions     Take antibiotic twice daily as prescribed with food. Return if you develop worsening pain, swelling, difficulty breathing.    ED Prescriptions    Medication Sig Dispense Auth. Provider   penicillin v potassium (VEETID) 500 MG tablet Take 1 tablet (500 mg total) by mouth 2 (two) times a day for 10 days. 20 tablet Hall-Potvin, [place], PA-C     Controlled Substance Prescriptions [place] Controlled Substance Registry consulted? Not Applicable   Shea EvansHall-Potvin, Brittany, [place]PA-C 06/01/19 1133

## 2019-06-01 NOTE — Discharge Instructions (Signed)
Take antibiotic twice daily as prescribed with food. Return if you develop worsening pain, swelling, difficulty breathing.
